# Patient Record
Sex: Female | Born: 1995 | Race: Black or African American | Hispanic: No | Marital: Single | Smoking: Never smoker
Health system: Southern US, Community
[De-identification: ages and names within clinical notes are randomized; demographics above are authoritative.]

## PROBLEM LIST (undated history)

## (undated) ENCOUNTER — Ambulatory Visit (HOSPITAL_COMMUNITY): Discharge status: Absent without leave | Payer: No Typology Code available for payment source

## (undated) DIAGNOSIS — Z789 Other specified health status: Secondary | ICD-10-CM

## (undated) HISTORY — PX: BREAST SURGERY: SHX581

---

## 2013-05-01 ENCOUNTER — Emergency Department (HOSPITAL_COMMUNITY)
Admission: EM | Admit: 2013-05-01 | Discharge: 2013-05-01 | Disposition: A | Discharge status: Home / Self Care | Payer: Medicaid Other | Attending: Emergency Medicine | Admitting: Emergency Medicine

## 2013-05-01 ENCOUNTER — Encounter (HOSPITAL_COMMUNITY): Payer: Self-pay | Admitting: Emergency Medicine

## 2013-05-01 DIAGNOSIS — Z3202 Encounter for pregnancy test, result negative: Secondary | ICD-10-CM | POA: Insufficient documentation

## 2013-05-01 DIAGNOSIS — R509 Fever, unspecified: Secondary | ICD-10-CM | POA: Insufficient documentation

## 2013-05-01 DIAGNOSIS — B761 Necatoriasis: Secondary | ICD-10-CM | POA: Insufficient documentation

## 2013-05-01 DIAGNOSIS — B760 Ancylostomiasis: Secondary | ICD-10-CM | POA: Insufficient documentation

## 2013-05-01 DIAGNOSIS — B769 Hookworm disease, unspecified: Secondary | ICD-10-CM

## 2013-05-01 LAB — URINE MICROSCOPIC-ADD ON

## 2013-05-01 LAB — URINALYSIS, ROUTINE W REFLEX MICROSCOPIC
Bilirubin Urine: NEGATIVE
Glucose, UA: NEGATIVE mg/dL
Ketones, ur: NEGATIVE mg/dL
Nitrite: NEGATIVE
Protein, ur: NEGATIVE mg/dL
Urobilinogen, UA: 0.2 mg/dL (ref 0.0–1.0)

## 2013-05-01 LAB — CBC WITH DIFFERENTIAL/PLATELET
Basophils Absolute: 0 10*3/uL (ref 0.0–0.1)
Basophils Relative: 0 % (ref 0–1)
HCT: 31.6 % — ABNORMAL LOW (ref 36.0–46.0)
Hemoglobin: 10.3 g/dL — ABNORMAL LOW (ref 12.0–15.0)
Lymphocytes Relative: 57 % — ABNORMAL HIGH (ref 12–46)
Lymphs Abs: 3.7 10*3/uL (ref 0.7–4.0)
MCHC: 32.6 g/dL (ref 30.0–36.0)
Monocytes Absolute: 0.5 10*3/uL (ref 0.1–1.0)
Monocytes Relative: 7 % (ref 3–12)
Neutro Abs: 2.2 10*3/uL (ref 1.7–7.7)
Neutrophils Relative %: 34 % — ABNORMAL LOW (ref 43–77)
RDW: 14.6 % (ref 11.5–15.5)
WBC: 6.5 10*3/uL (ref 4.0–10.5)

## 2013-05-01 LAB — COMPREHENSIVE METABOLIC PANEL
AST: 25 U/L (ref 0–37)
Albumin: 3.4 g/dL — ABNORMAL LOW (ref 3.5–5.2)
Alkaline Phosphatase: 36 U/L — ABNORMAL LOW (ref 39–117)
CO2: 26 mEq/L (ref 19–32)
Chloride: 108 mEq/L (ref 96–112)
Creatinine, Ser: 1.06 mg/dL (ref 0.50–1.10)
GFR calc Af Amer: 88 mL/min — ABNORMAL LOW (ref >90)
GFR calc non Af Amer: 76 mL/min — ABNORMAL LOW (ref >90)
Glucose, Bld: 85 mg/dL (ref 70–99)
Potassium: 4.8 mEq/L (ref 3.5–5.1)
Total Bilirubin: 0.2 mg/dL — ABNORMAL LOW (ref 0.3–1.2)

## 2013-05-01 LAB — POCT PREGNANCY, URINE: Preg Test, Ur: NEGATIVE

## 2013-05-01 MED ORDER — ALBENDAZOLE 200 MG PO TABS
400.0000 mg | ORAL_TABLET | Freq: Once | ORAL | Status: AC
  Administered 2013-05-01: 400 mg via ORAL
  Filled 2013-05-01: qty 2

## 2013-05-01 NOTE — ED Provider Notes (Signed)
CSN: [id number]     Arrival date & time 05/01/13  4098 History   First MD Initiated Contact with Patient 05/01/13 0254     Chief Complaint  Patient presents with  . Abdominal Pain   (Consider location/radiation/quality/duration/timing/severity/associated sxs/prior Treatment) Patient is a 17 y.o. female presenting with abdominal pain. The history is provided by the patient.  Abdominal Pain  is complaining of generalized lower abdominal pain without associated fever, chills. Noted in her stool this evening that she saw what looked like a worm. Denies any anal itching. No vomiting or diarrhea. No recent travel history. No strange foods. She has no abdominal pain at this time. He feels back to her normal self. Denies any urinary symptoms. History of similar symptoms in the past without diagnosis.  History reviewed. No pertinent past medical history. History reviewed. No pertinent past surgical history. No family history on file. History  Substance Use Topics  . Smoking status: Never Smoker   . Smokeless tobacco: Not on file  . Alcohol Use: No   OB History   Grav Para Term Preterm Abortions TAB SAB Ect Mult Living                 Review of Systems  Gastrointestinal: Positive for abdominal pain.  All other systems reviewed and are negative.    Allergies  Review of patient's allergies indicates no known allergies.  Home Medications  No current outpatient prescriptions on file. BP 116/77  Pulse 43  Temp(Src) 97.9 F (36.6 C) (Oral)  Resp 16  SpO2 100%  LMP 04/26/2013 Physical Exam  Nursing note and vitals reviewed. Constitutional: She is oriented to person, place, and time. She appears well-developed and well-nourished.  Non-toxic appearance. No distress.  HENT:  Head: Normocephalic and atraumatic.  Eyes: Conjunctivae, EOM and lids are normal. Pupils are equal, round, and reactive to light.  Neck: Normal range of motion. Neck supple. No tracheal deviation present. No mass  present.  Cardiovascular: Normal rate, regular rhythm and normal heart sounds.  Exam reveals no gallop.   No murmur heard. Pulmonary/Chest: Effort normal and breath sounds normal. No stridor. No respiratory distress. She has no decreased breath sounds. She has no wheezes. She has no rhonchi. She has no rales.  Abdominal: Soft. Normal appearance and bowel sounds are normal. She exhibits no distension. There is no tenderness. There is no rebound and no CVA tenderness.  Musculoskeletal: Normal range of motion. She exhibits no edema and no tenderness.  Neurological: She is alert and oriented to person, place, and time. She has normal strength. No cranial nerve deficit or sensory deficit. GCS eye subscore is 4. GCS verbal subscore is 5. GCS motor subscore is 6.  Skin: Skin is warm and dry. No abrasion and no rash noted.  Psychiatric: She has a normal mood and affect. Her speech is normal and behavior is normal.    ED Course  Procedures (including critical care time) Labs Review Labs Reviewed  CBC WITH DIFFERENTIAL - Abnormal; Notable for the following:    RBC 3.65 (*)    Hemoglobin 10.3 (*)    HCT 31.6 (*)    Neutrophils Relative % 34 (*)    Lymphocytes Relative 57 (*)    All other components within normal limits  OVA AND PARASITE EXAMINATION  COMPREHENSIVE METABOLIC PANEL  URINALYSIS, ROUTINE W REFLEX MICROSCOPIC   Imaging Review No results found.  EKG Interpretation   None       MDM  No diagnosis found. Patient  is to be treated for suspected hook worm infection.    Toy Baker, MD 05/01/13 [phone]

## 2013-05-01 NOTE — ED Notes (Signed)
Pt. reports low abdominal cramping and noted worms at stools this evening , denies rectal itching or bleeding.

## 2013-05-01 NOTE — ED Notes (Signed)
Dr Allen at bedside  

## 2013-05-02 LAB — URINE CULTURE: Colony Count: 35000

## 2013-05-04 LAB — OVA AND PARASITE EXAMINATION: Ova and parasites: NONE SEEN

## 2013-05-05 LAB — STOOL CULTURE: Special Requests: NORMAL

## 2013-08-16 ENCOUNTER — Encounter (HOSPITAL_COMMUNITY): Payer: Self-pay | Admitting: Emergency Medicine

## 2013-08-16 ENCOUNTER — Emergency Department (HOSPITAL_COMMUNITY)
Admission: EM | Admit: 2013-08-16 | Discharge: 2013-08-16 | Disposition: A | Discharge status: Home / Self Care | Payer: Medicaid Other | Attending: Emergency Medicine | Admitting: Emergency Medicine

## 2013-08-16 DIAGNOSIS — R102 Pelvic and perineal pain: Secondary | ICD-10-CM

## 2013-08-16 DIAGNOSIS — Z3202 Encounter for pregnancy test, result negative: Secondary | ICD-10-CM | POA: Insufficient documentation

## 2013-08-16 DIAGNOSIS — N949 Unspecified condition associated with female genital organs and menstrual cycle: Secondary | ICD-10-CM | POA: Insufficient documentation

## 2013-08-16 LAB — URINE MICROSCOPIC-ADD ON

## 2013-08-16 LAB — URINALYSIS, ROUTINE W REFLEX MICROSCOPIC
BILIRUBIN URINE: NEGATIVE
GLUCOSE, UA: NEGATIVE mg/dL
KETONES UR: 15 mg/dL — AB
Leukocytes, UA: NEGATIVE
Nitrite: NEGATIVE
PROTEIN: 30 mg/dL — AB
Specific Gravity, Urine: 1.041 — ABNORMAL HIGH (ref 1.005–1.030)
UROBILINOGEN UA: 1 mg/dL (ref 0.0–1.0)
pH: 6 (ref 5.0–8.0)

## 2013-08-16 LAB — CBC WITH DIFFERENTIAL/PLATELET
Basophils Absolute: 0 10*3/uL (ref 0.0–0.1)
Basophils Relative: 0 % (ref 0–1)
EOS PCT: 2 % (ref 0–5)
Eosinophils Absolute: 0.1 10*3/uL (ref 0.0–0.7)
HCT: 30.6 % — ABNORMAL LOW (ref 36.0–46.0)
Hemoglobin: 10.1 g/dL — ABNORMAL LOW (ref 12.0–15.0)
LYMPHS ABS: 2.8 10*3/uL (ref 0.7–4.0)
LYMPHS PCT: 40 % (ref 12–46)
MCH: 27.3 pg (ref 26.0–34.0)
MCHC: 33 g/dL (ref 30.0–36.0)
MCV: 82.7 fL (ref 78.0–100.0)
Monocytes Absolute: 0.5 10*3/uL (ref 0.1–1.0)
Monocytes Relative: 7 % (ref 3–12)
NEUTROS ABS: 3.5 10*3/uL (ref 1.7–7.7)
Neutrophils Relative %: 51 % (ref 43–77)
PLATELETS: 341 10*3/uL (ref 150–400)
RBC: 3.7 MIL/uL — AB (ref 3.87–5.11)
RDW: 15.2 % (ref 11.5–15.5)
WBC: 6.9 10*3/uL (ref 4.0–10.5)

## 2013-08-16 LAB — COMPREHENSIVE METABOLIC PANEL
ALK PHOS: 40 U/L (ref 39–117)
ALT: 13 U/L (ref 0–35)
AST: 27 U/L (ref 0–37)
Albumin: 3.7 g/dL (ref 3.5–5.2)
BILIRUBIN TOTAL: 0.3 mg/dL (ref 0.3–1.2)
BUN: 15 mg/dL (ref 6–23)
CALCIUM: 8.9 mg/dL (ref 8.4–10.5)
CHLORIDE: 103 meq/L (ref 96–112)
CO2: 23 meq/L (ref 19–32)
Creatinine, Ser: 0.9 mg/dL (ref 0.50–1.10)
GLUCOSE: 94 mg/dL (ref 70–99)
Potassium: 3.8 mEq/L (ref 3.7–5.3)
SODIUM: 138 meq/L (ref 137–147)
Total Protein: 6.8 g/dL (ref 6.0–8.3)

## 2013-08-16 LAB — WET PREP, GENITAL
Trich, Wet Prep: NONE SEEN
WBC, Wet Prep HPF POC: NONE SEEN
Yeast Wet Prep HPF POC: NONE SEEN

## 2013-08-16 LAB — LIPASE, BLOOD: Lipase: 38 U/L (ref 11–59)

## 2013-08-16 LAB — PREGNANCY, URINE: Preg Test, Ur: NEGATIVE

## 2013-08-16 MED ORDER — METRONIDAZOLE 500 MG PO TABS: 500.0000 mg | ORAL_TABLET | Freq: Two times a day (BID) | ORAL | Status: DC

## 2013-08-16 MED ORDER — NAPROXEN 250 MG PO TABS
500.0000 mg | ORAL_TABLET | Freq: Once | ORAL | Status: AC
  Administered 2013-08-16: 500 mg via ORAL
  Filled 2013-08-16: qty 2

## 2013-08-16 MED ORDER — NAPROXEN 500 MG PO TABS: 500.0000 mg | ORAL_TABLET | Freq: Two times a day (BID) | ORAL | Status: DC

## 2013-08-16 NOTE — Discharge Instructions (Signed)
Pelvic Infection ° °If you have been diagnosed with a pelvic infection such as a sexually transmitted disease, you will need to be treated with antibiotics. Please take the medicines as prescribed. Some of these tests do not come back for 1-2 days in which case if they turn positive you will receive a phone call to let you know. If you are contacted and do have an infection consistent with a sexually transmitted disease, then you will need to tell any and all sexual partners that you have had in the last 6 months no so that they can be tested and treated as well. If you should develop severe or worsening pain in your abdomen or the pelvis or develop severe fevers,nausea or vomiting that prevent you from taking your medications, return to the emergency department immediately. Otherwise contact your local physician or county health department for a follow up appointment to complete STD testing including HIV and syphilis.  See the list of phone numbers below. ° °RESOURCE GUIDE ° °Dental Problems ° °Patients with Medicaid: °[facility]                      Dental °[address]                                           [address] °Phone:  [phone]                                                  Phone:  [phone] ° °If unable to pay or uninsured, contact:  [facility] or [facility] to become qualified for the adult dental clinic. ° °Chronic Pain Problems °Contact [facility]  [phone] °Patients need to be referred by their primary care doctor. ° °Insufficient Money for Medicine °Contact [employer]:  call "211" or [facility] 271-5999. ° °No Primary Care Doctor °Call [facility]  832-8000 °Other agencies that provide inexpensive medical care °   [facility]  [phone] °   [facility]  [phone] °   [facility]  [phone] °   [facility]  [phone] °   [facility]  [phone] ° [facility]  [phone] ° °Psychological Services °[facility]  [phone] °[facility]  [phone] °[facility]   [phone] (emergency services [phone]) ° °Substance Abuse Resources °[facility]  336-882-2125 °[facility] 336-784-9470 °[facility] 336-285-9073 °[facility] 336-845-3988 °Residential & Outpatient Substance Abuse Program  800-659-3381 ° °Abuse/Neglect °[place] [facility] (336) 641-3795 °[place] [facility] 800-378-5315 (After Hours) ° °Emergency Shelter °[facility] (336) 271-5985 ° °Maternity Homes °[facility] (336) 275-9566 °[facility] (704) 372-4663 ° °MRSA Hotline #:   832-7006 ° ° ° °[place] [facility] ° °[facility]     [employer]                          [facility] °[address] [place]                         [address]      [address]  °[place]                                                [place]                            [place] °Phone:  349-3220                                   Phone:  [phone]                 Phone:  342-8140 ° °[facility] °Phone:  342-8316 ° °[place] [facility] °(336) 342-1394 °(336) 342-3537 (After Hours) ° ° ° °

## 2013-08-16 NOTE — ED Notes (Signed)
The pt is c/o lower abd pain for 2 days nausea.  lmp today

## 2013-08-16 NOTE — ED Provider Notes (Signed)
CSN: [id number]     Arrival date & time 08/16/13  0110 History   First MD Initiated Contact with Patient 08/16/13 [phone]     Chief Complaint  Patient presents with  . Abdominal Pain     (Consider location/radiation/quality/duration/timing/severity/associated sxs/prior Treatment) HPI Comments: 18 year old female presents with one day of abdominal pain, bilateral lower abdominal pain in location, no radiation of the pain, no vomiting however she does have some nausea. There is no diarrhea, no dysuria, small amount of vaginal discharge which has been present for several days. She does have a history of Chlamydia. Has taken ibuprofen prior to arrival.  Patient is a 18 y.o. female presenting with abdominal pain. The history is provided by the patient.  Abdominal Pain   History reviewed. No pertinent past medical history. History reviewed. No pertinent past surgical history. No family history on file. History  Substance Use Topics  . Smoking status: Never Smoker   . Smokeless tobacco: Not on file  . Alcohol Use: No   OB History   Grav Para Term Preterm Abortions TAB SAB Ect Mult Living                 Review of Systems  Gastrointestinal: Positive for abdominal pain.  All other systems reviewed and are negative.      Allergies  Review of patient's allergies indicates no known allergies.  Home Medications   Current Outpatient Rx  Name  Route  Sig  Dispense  Refill  . ibuprofen (ADVIL,MOTRIN) 200 MG tablet   Oral   Take 800 mg by mouth every 6 (six) hours as needed for headache or moderate pain.         . metroNIDAZOLE (FLAGYL) 500 MG tablet   Oral   Take 1 tablet (500 mg total) by mouth 2 (two) times daily.   14 tablet   0   . naproxen (NAPROSYN) 500 MG tablet   Oral   Take 1 tablet (500 mg total) by mouth 2 (two) times daily with a meal.   30 tablet   0    BP 100/63  Pulse 56  Temp(Src) 97.9 F (36.6 C) (Oral)  Resp 16  Ht 5\' 8"  (1.727 m)  Wt 138 lb 1 oz  (62.625 kg)  BMI 21.00 kg/m2  SpO2 100%  LMP 08/16/2013 Physical Exam  Nursing note and vitals reviewed. Constitutional: She appears well-developed and well-nourished. No distress.  HENT:  Head: Normocephalic and atraumatic.  Mouth/Throat: Oropharynx is clear and moist. No oropharyngeal exudate.  Eyes: Conjunctivae and EOM are normal. Pupils are equal, round, and reactive to light. Right eye exhibits no discharge. Left eye exhibits no discharge. No scleral icterus.  Neck: Normal range of motion. Neck supple. No JVD present. No thyromegaly present.  Cardiovascular: Normal rate, regular rhythm, normal heart sounds and intact distal pulses.  Exam reveals no gallop and no friction rub.   No murmur heard. Pulmonary/Chest: Effort normal and breath sounds normal. No respiratory distress. She has no wheezes. She has no rales.  Abdominal: Soft. Bowel sounds are normal. She exhibits no distension and no mass. There is tenderness ( Minimal tenderness to the bilateral lower abdomin).  Genitourinary:  Chaperone present for exam, small amount of blood in the vaginal vault, no other discharge, no cervical motion tenderness or adnexal tenderness or masses.  Musculoskeletal: Normal range of motion. She exhibits no edema and no tenderness.  Lymphadenopathy:    She has no cervical adenopathy.  Neurological: She is alert. Coordination  normal.  Skin: Skin is warm and dry. No rash noted. No erythema.  Psychiatric: She has a normal mood and affect. Her behavior is normal.    ED Course  Procedures (including critical care time) Labs Review Labs Reviewed  CBC WITH DIFFERENTIAL - Abnormal; Notable for the following:    RBC 3.70 (*)    Hemoglobin 10.1 (*)    HCT 30.6 (*)    All other components within normal limits  URINALYSIS, ROUTINE W REFLEX MICROSCOPIC - Abnormal; Notable for the following:    Specific Gravity, Urine 1.041 (*)    Hgb urine dipstick LARGE (*)    Ketones, ur 15 (*)    Protein, ur 30  (*)    All other components within normal limits  URINE MICROSCOPIC-ADD ON - Abnormal; Notable for the following:    Squamous Epithelial / LPF FEW (*)    All other components within normal limits  GC/CHLAMYDIA PROBE AMP  WET PREP, GENITAL  COMPREHENSIVE METABOLIC PANEL  PREGNANCY, URINE  LIPASE, BLOOD   Imaging Review No results found.   EKG Interpretation None      MDM   Final diagnoses:  Pelvic pain    Labs a urinalysis unremarkable, vital signs normal, we'll check for STDs, BV, the patient is stable.  The patient has a benign presentation, she is stable for discharge  Meds given in ED:  Medications  naproxen (NAPROSYN) tablet 500 mg (not administered)    New Prescriptions   METRONIDAZOLE (FLAGYL) 500 MG TABLET    Take 1 tablet (500 mg total) by mouth 2 (two) times daily.   NAPROXEN (NAPROSYN) 500 MG TABLET    Take 1 tablet (500 mg total) by mouth 2 (two) times daily with a meal.      Vida Roller, MD 08/16/13 [phone]

## 2013-08-17 LAB — GC/CHLAMYDIA PROBE AMP
CT PROBE, AMP APTIMA: NEGATIVE
GC Probe RNA: NEGATIVE

## 2013-12-11 ENCOUNTER — Emergency Department (INDEPENDENT_AMBULATORY_CARE_PROVIDER_SITE_OTHER)
Admission: EM | Admit: 2013-12-11 | Discharge: 2013-12-11 | Disposition: A | Discharge status: Home / Self Care | Payer: Medicaid Other | Source: Home / Self Care | Attending: Emergency Medicine | Admitting: Emergency Medicine

## 2013-12-11 ENCOUNTER — Encounter (HOSPITAL_COMMUNITY): Payer: Self-pay | Admitting: Emergency Medicine

## 2013-12-11 DIAGNOSIS — T280XXA Burn of mouth and pharynx, initial encounter: Secondary | ICD-10-CM

## 2013-12-11 NOTE — ED Provider Notes (Signed)
  Chief Complaint   Chief Complaint  Patient presents with  . Mouth Lesions    History of Present Illness   Emma Thompson is a 18 year old female who noted a spot on the roof of her mouth this evening after eating at a restaurant. The patient had a burger and some fries. She does not think the food was particularly hot. The spot is not painful. She denies any other intraoral lesions, sore throat, adenopathy, fever, headache, stiff neck, nasal congestion, rhinorrhea.  Review of Systems   Other than as noted above, the patient denies any of the following symptoms: Systemic:  No fevers or chills. Eye:  No redness, pain, discharge, itching, blurred vision, or diplopia. ENT:  No headache, nasal congestion, sneezing, itching, epistaxis, ear pain, decreased hearing, ringing in ears, vertigo, or tinnitus.  No oral lesions, sore throat, or hoarseness. Neck:  No neck pain or adenopathy. Skin:  No rash or itching.  PMFSH   Past medical history, family history, social history, meds, and allergies were reviewed.   Physical Examination     Vital signs:  BP 106/65  Pulse 71  Temp(Src) 98.8 F (37.1 C) (Oral)  Resp 14  SpO2 100% General:  Alert and oriented.  In no distress.  Skin warm and dry. Eye:  PERRL, full EOMs, lids and conjunctiva normal.   ENT:  TMs and canals clear.  Nasal mucosa not congested and without drainage.  Mucous membranes moist, she has a small, blood-filled blister on the hard palate, adjacent to the right, upper first and second molars, normal dentition, pharynx clear.  No cranial or facial pain to palplation. There is no pain or swelling over the mastoid. Neck:  Supple, full ROM.  No adenopathy, tenderness or mass.  Thyroid normal. Lungs:  Breath sounds clear and equal bilaterally.  No wheezes, rales or rhonchi. Heart:  Rhythm regular, without extrasystoles.  No gallops or murmers. Skin:  Clear, warm and dry.   Assessment   The encounter diagnosis was Mouth burn,  initial encounter.  Probably due to eating hot food.  Plan    1.  Meds:  The following meds were prescribed:   Discharge Medication List as of 12/11/2013  8:28 PM      2.  Patient Education/Counseling:  The patient was given appropriate handouts, self care instructions, and instructed in symptomatic relief.    3.  Follow up:  The patient was told to follow up here if no better in 3 to 4 days, or sooner if becoming worse in any way, and given some red flag symptoms such as further blistering of the mouth, severe pain, or difficulty swallowing or breathing which would prompt immediate return.       Reuben Likesavid C Vonzell Lindblad, MD 12/11/13 2113

## 2013-12-11 NOTE — Discharge Instructions (Signed)
Rinse mouth with warm salt water 4 times daily.  Avoid spicey food, acid food, and very hot food.

## 2013-12-11 NOTE — ED Notes (Signed)
Concerned about red spot on roof she noted after eating tonight. Her girlfriend here for STD check

## 2014-05-10 ENCOUNTER — Emergency Department (INDEPENDENT_AMBULATORY_CARE_PROVIDER_SITE_OTHER)
Admission: EM | Admit: 2014-05-10 | Discharge: 2014-05-10 | Disposition: A | Discharge status: Home / Self Care | Payer: Medicaid Other | Source: Home / Self Care | Attending: Family Medicine | Admitting: Family Medicine

## 2014-05-10 ENCOUNTER — Encounter (HOSPITAL_COMMUNITY): Payer: Self-pay | Admitting: Emergency Medicine

## 2014-05-10 DIAGNOSIS — N390 Urinary tract infection, site not specified: Secondary | ICD-10-CM

## 2014-05-10 LAB — POCT URINALYSIS DIP (DEVICE)
Bilirubin Urine: NEGATIVE
Glucose, UA: NEGATIVE mg/dL
Ketones, ur: NEGATIVE mg/dL
NITRITE: NEGATIVE
PH: 7 (ref 5.0–8.0)
Protein, ur: NEGATIVE mg/dL
Specific Gravity, Urine: 1.02 (ref 1.005–1.030)
UROBILINOGEN UA: 0.2 mg/dL (ref 0.0–1.0)

## 2014-05-10 LAB — POCT PREGNANCY, URINE: Preg Test, Ur: NEGATIVE

## 2014-05-10 MED ORDER — FLUCONAZOLE 150 MG PO TABS: 150.0000 mg | ORAL_TABLET | Freq: Once | ORAL | Status: DC

## 2014-05-10 MED ORDER — CEPHALEXIN 500 MG PO CAPS: 500.0000 mg | ORAL_CAPSULE | Freq: Two times a day (BID) | ORAL | Status: DC

## 2014-05-10 NOTE — ED Provider Notes (Signed)
Emma Thompson is a 18 y.o. female who presents to Urgent Care today for pelvic pain. Patient is a 2 day history of mild lower abdominal/pelvic pain. She notes this is associated with urinary frequency and urgency and dysuria. She has not tried any medications yet. 5 days ago she [facility] and is taking over-the-counter creams. At that time gonorrhea and Chlamydia were negative. No fevers or chills vomiting or diarrhea.   History reviewed. No pertinent past medical history. History reviewed. No pertinent past surgical history. History  Substance Use Topics  . Smoking status: Never Smoker   . Smokeless tobacco: Not on file  . Alcohol Use: No   ROS as above Medications: No current facility-administered medications for this encounter.   Current Outpatient Prescriptions  Medication Sig Dispense Refill  . cephALEXin (KEFLEX) 500 MG capsule Take 1 capsule (500 mg total) by mouth 2 (two) times daily. 14 capsule 0  . fluconazole (DIFLUCAN) 150 MG tablet Take 1 tablet (150 mg total) by mouth once. 1 tablet 1  . ibuprofen (ADVIL,MOTRIN) 200 MG tablet Take 800 mg by mouth every 6 (six) hours as needed for headache or moderate pain.    . naproxen (NAPROSYN) 500 MG tablet Take 1 tablet (500 mg total) by mouth 2 (two) times daily with a meal. 30 tablet 0   No Known Allergies   Exam:  BP 109/66 mmHg  Pulse 80  Temp(Src) 98.2 F (36.8 C) (Oral)  Resp 14  SpO2 99%  LMP 05/05/2014 Gen: Well NAD HEENT: EOMI,  MMM Lungs: Normal work of breathing. CTABL Heart: RRR no MRG Abd: NABS, Soft. Nondistended, Nontender no CV angle tenderness to percussion Exts: Brisk capillary refill, warm and well perfused.   Results for orders placed or performed during the hospital encounter of 05/10/14 (from the past 24 hour(s))  POCT urinalysis dip (device)     Status: Abnormal   Collection Time: 05/10/14  4:14 PM  Result Value Ref Range   Glucose, UA NEGATIVE  NEGATIVE mg/dL   Bilirubin Urine NEGATIVE NEGATIVE   Ketones, ur NEGATIVE NEGATIVE mg/dL   Specific Gravity, Urine 1.020 1.005 - 1.030   Hgb urine dipstick SMALL (A) NEGATIVE   pH 7.0 5.0 - 8.0   Protein, ur NEGATIVE NEGATIVE mg/dL   Urobilinogen, UA 0.2 0.0 - 1.0 mg/dL   Nitrite NEGATIVE NEGATIVE   Leukocytes, UA TRACE (A) NEGATIVE  Pregnancy, urine POC     Status: None   Collection Time: 05/10/14  4:17 PM  Result Value Ref Range   Preg Test, Ur NEGATIVE NEGATIVE   No results found.  Assessment and Plan: 18 y.o. female with UTI most likely explanation. Urine culture pending. Defer gynecologic exam as this has just been done. Treat with Keflex and fluconazole. Follow-up with PCP as needed.  Discussed warning signs or symptoms. Please see discharge instructions. Patient expresses understanding.     Rodolph BongEvan S Corey, MD 05/10/14 [phone]

## 2014-05-10 NOTE — Discharge Instructions (Signed)
Thank you for coming in today. Take keflex twice daily for 1 week.  Take fluconazole once.  If your belly pain worsens, or you have high fever, bad vomiting, blood in your stool or black tarry stool go to the Emergency Room.    Urinary Tract Infection Urinary tract infections (UTIs) can develop anywhere along your urinary tract. Your urinary tract is your body's drainage system for removing wastes and extra water. Your urinary tract includes two kidneys, two ureters, a bladder, and a urethra. Your kidneys are a pair of bean-shaped organs. Each kidney is about the size of your fist. They are located below your ribs, one on each side of your spine. CAUSES Infections are caused by microbes, which are microscopic organisms, including fungi, viruses, and bacteria. These organisms are so small that they can only be seen through a microscope. Bacteria are the microbes that most commonly cause UTIs. SYMPTOMS  Symptoms of UTIs may vary by age and gender of the patient and by the location of the infection. Symptoms in young women typically include a frequent and intense urge to urinate and a painful, burning feeling in the bladder or urethra during urination. Older women and men are more likely to be tired, shaky, and weak and have muscle aches and abdominal pain. A fever may mean the infection is in your kidneys. Other symptoms of a kidney infection include pain in your back or sides below the ribs, nausea, and vomiting. DIAGNOSIS To diagnose a UTI, your caregiver will ask you about your symptoms. Your caregiver also will ask to provide a urine sample. The urine sample will be tested for bacteria and white blood cells. White blood cells are made by your body to help fight infection. TREATMENT  Typically, UTIs can be treated with medication. Because most UTIs are caused by a bacterial infection, they usually can be treated with the use of antibiotics. The choice of antibiotic and length of treatment depend on  your symptoms and the type of bacteria causing your infection. HOME CARE INSTRUCTIONS  If you were prescribed antibiotics, take them exactly as your caregiver instructs you. Finish the medication even if you feel better after you have only taken some of the medication.  Drink enough water and fluids to keep your urine clear or pale yellow.  Avoid caffeine, tea, and carbonated beverages. They tend to irritate your bladder.  Empty your bladder often. Avoid holding urine for long periods of time.  Empty your bladder before and after sexual intercourse.  After a bowel movement, women should cleanse from front to back. Use each tissue only once. SEEK MEDICAL CARE IF:   You have back pain.  You develop a fever.  Your symptoms do not begin to resolve within 3 days. SEEK IMMEDIATE MEDICAL CARE IF:   You have severe back pain or lower abdominal pain.  You develop chills.  You have nausea or vomiting.  You have continued burning or discomfort with urination. MAKE SURE YOU:   Understand these instructions.  Will watch your condition.  Will get help right away if you are not doing well or get worse. Document Released: 02/21/2005 Document Revised: 11/13/2011 Document Reviewed: 06/22/2011 [facility] Patient Information 2015 [place], [place]. This information is not intended to replace advice given to you by your health care provider. Make sure you discuss any questions you have with your health care provider.

## 2014-05-11 LAB — URINE CULTURE: SPECIAL REQUESTS: NORMAL

## 2014-05-26 ENCOUNTER — Other Ambulatory Visit (HOSPITAL_COMMUNITY): Payer: Self-pay | Admitting: General Practice

## 2014-05-26 DIAGNOSIS — IMO0002 Reserved for concepts with insufficient information to code with codable children: Secondary | ICD-10-CM

## 2014-05-26 DIAGNOSIS — R229 Localized swelling, mass and lump, unspecified: Principal | ICD-10-CM

## 2014-06-01 ENCOUNTER — Other Ambulatory Visit (HOSPITAL_COMMUNITY): Payer: Self-pay | Admitting: General Practice

## 2014-06-01 ENCOUNTER — Ambulatory Visit (HOSPITAL_COMMUNITY)
Admission: RE | Admit: 2014-06-01 | Discharge: 2014-06-01 | Disposition: A | Discharge status: Home / Self Care | Payer: Medicaid Other | Source: Ambulatory Visit | Attending: General Practice | Admitting: General Practice

## 2014-06-01 DIAGNOSIS — R229 Localized swelling, mass and lump, unspecified: Secondary | ICD-10-CM

## 2014-06-01 DIAGNOSIS — N63 Unspecified lump in breast: Secondary | ICD-10-CM | POA: Insufficient documentation

## 2014-06-01 DIAGNOSIS — IMO0002 Reserved for concepts with insufficient information to code with codable children: Secondary | ICD-10-CM

## 2014-08-07 ENCOUNTER — Encounter (HOSPITAL_COMMUNITY): Payer: Self-pay

## 2014-08-07 ENCOUNTER — Inpatient Hospital Stay (HOSPITAL_COMMUNITY)
Admission: AD | Admit: 2014-08-07 | Discharge: 2014-08-07 | Disposition: A | Discharge status: Home / Self Care | Payer: Medicaid Other | Source: Ambulatory Visit | Attending: Family Medicine | Admitting: Family Medicine

## 2014-08-07 DIAGNOSIS — Z3202 Encounter for pregnancy test, result negative: Secondary | ICD-10-CM | POA: Diagnosis not present

## 2014-08-07 DIAGNOSIS — B3731 Acute candidiasis of vulva and vagina: Secondary | ICD-10-CM

## 2014-08-07 DIAGNOSIS — B373 Candidiasis of vulva and vagina: Secondary | ICD-10-CM | POA: Insufficient documentation

## 2014-08-07 DIAGNOSIS — N898 Other specified noninflammatory disorders of vagina: Secondary | ICD-10-CM | POA: Insufficient documentation

## 2014-08-07 HISTORY — DX: Other specified health status: Z78.9

## 2014-08-07 LAB — POCT PREGNANCY, URINE: PREG TEST UR: NEGATIVE

## 2014-08-07 LAB — URINALYSIS, ROUTINE W REFLEX MICROSCOPIC
Bilirubin Urine: NEGATIVE
GLUCOSE, UA: NEGATIVE mg/dL
Ketones, ur: NEGATIVE mg/dL
NITRITE: NEGATIVE
Protein, ur: NEGATIVE mg/dL
Specific Gravity, Urine: 1.025 (ref 1.005–1.030)
Urobilinogen, UA: 0.2 mg/dL (ref 0.0–1.0)
pH: 6 (ref 5.0–8.0)

## 2014-08-07 LAB — URINE MICROSCOPIC-ADD ON

## 2014-08-07 LAB — WET PREP, GENITAL: Trich, Wet Prep: NONE SEEN

## 2014-08-07 MED ORDER — FLUCONAZOLE 150 MG PO TABS: 150.0000 mg | ORAL_TABLET | Freq: Every day | ORAL | Status: DC

## 2014-08-07 NOTE — Discharge Instructions (Signed)

## 2014-08-07 NOTE — MAU Note (Signed)
[facility] and trich.  Completed meds and when finished meds, started having a milky discharge and felt itchy x 2days.

## 2014-08-07 NOTE — MAU Provider Note (Signed)
History     CSN: [id number]  Arrival date and time: 08/07/14 0403   First Provider Initiated Contact with Patient 08/07/14 0444      Chief Complaint  Patient presents with  . Vaginal Discharge   HPI    [name] is a  19 y.o. female who presents with vaginal discharge. She recently completed a 7 day course of flagyl for trichomonas and BV. Following treatment she has noticed a thick, white discharge.   OB History    Gravida Para Term Preterm AB TAB SAB Ectopic Multiple Living   0 0 0 0 0 0 0 0 0 0       Past Medical History  Diagnosis Date  . Medical history non-contributory     Past Surgical History  Procedure Laterality Date  . No past surgeries      Family History  Problem Relation Age of Onset  . Hyperlipidemia Sister     History  Substance Use Topics  . Smoking status: Never Smoker   . Smokeless tobacco: Not on file  . Alcohol Use: No    Allergies: No Known Allergies  Prescriptions prior to admission  Medication Sig Dispense Refill Last Dose  . cephALEXin (KEFLEX) 500 MG capsule Take 1 capsule (500 mg total) by mouth 2 (two) times daily. 14 capsule 0   . fluconazole (DIFLUCAN) 150 MG tablet Take 1 tablet (150 mg total) by mouth once. 1 tablet 1   . ibuprofen (ADVIL,MOTRIN) 200 MG tablet Take 800 mg by mouth every 6 (six) hours as needed for headache or moderate pain.   Unknown at Unknown time  . naproxen (NAPROSYN) 500 MG tablet Take 1 tablet (500 mg total) by mouth 2 (two) times daily with a meal. 30 tablet 0 Unknown at Unknown time   Results for orders placed or performed during the hospital encounter of 08/07/14 (from the past 48 hour(s))  Wet prep, genital     Status: Abnormal   Collection Time: 08/07/14  4:20 AM  Result Value Ref Range   Yeast Wet Prep HPF POC FEW (A) NONE SEEN   Trich, Wet Prep NONE SEEN NONE SEEN   Clue Cells Wet Prep HPF POC RARE (A) NONE SEEN   WBC, Wet Prep HPF POC MODERATE (A) NONE SEEN    Comment: MANY BACTERIA  SEEN  Urinalysis, Routine w reflex microscopic     Status: Abnormal   Collection Time: 08/07/14  4:20 AM  Result Value Ref Range   Color, Urine YELLOW YELLOW   APPearance CLEAR CLEAR   Specific Gravity, Urine 1.025 1.005 - 1.030   pH 6.0 5.0 - 8.0   Glucose, UA NEGATIVE NEGATIVE mg/dL   Hgb urine dipstick TRACE (A) NEGATIVE   Bilirubin Urine NEGATIVE NEGATIVE   Ketones, ur NEGATIVE NEGATIVE mg/dL   Protein, ur NEGATIVE NEGATIVE mg/dL   Urobilinogen, UA 0.2 0.0 - 1.0 mg/dL   Nitrite NEGATIVE NEGATIVE   Leukocytes, UA SMALL (A) NEGATIVE  Urine microscopic-add on     Status: None   Collection Time: 08/07/14  4:20 AM  Result Value Ref Range   Squamous Epithelial / LPF RARE RARE   WBC, UA 0-2 <3 WBC/hpf   RBC / HPF 0-2 <3 RBC/hpf   Bacteria, UA RARE RARE  Pregnancy, urine POC     Status: None   Collection Time: 08/07/14  4:37 AM  Result Value Ref Range   Preg Test, Ur NEGATIVE NEGATIVE    Comment:  THE SENSITIVITY OF THIS METHODOLOGY IS >24 mIU/mL      Review of Systems  Constitutional: Negative for fever and chills.  Genitourinary: Negative for dysuria, urgency, frequency and hematuria.       + vaginal itching    Physical Exam   Blood pressure 112/70, pulse 75, temperature 98.4 F (36.9 C), temperature source Oral, resp. rate 18, height  (1.676 m), weight 61.803 kg (136 lb 4 oz), last menstrual period 07/29/2014, SpO2 100 %.  Physical Exam  Constitutional: She is oriented to person, place, and time. She appears well-developed and well-nourished. No distress.  Genitourinary:  Patient collected wet prep and GC  Neurological: She is alert and oriented to person, place, and time.  Skin: Skin is warm. She is not diaphoretic.  Psychiatric: Her behavior is normal.    MAU Course  Procedures  None  MDM HIV UPT UA  Wet prep GC  Assessment and Plan   A:  1. Yeast vaginitis    P:  Discharge home in stable condition RX: Diflucan Follow up with HD  as needed Condoms always   Emma Lope, NP 08/07/2014 4:46 AM

## 2014-08-08 LAB — HIV ANTIBODY (ROUTINE TESTING W REFLEX): HIV Screen 4th Generation wRfx: NONREACTIVE

## 2014-08-09 LAB — GC/CHLAMYDIA PROBE AMP (~~LOC~~) NOT AT ARMC
Chlamydia: NEGATIVE
Neisseria Gonorrhea: NEGATIVE

## 2014-08-22 ENCOUNTER — Other Ambulatory Visit (HOSPITAL_COMMUNITY)
Admission: RE | Admit: 2014-08-22 | Discharge: 2014-08-22 | Disposition: A | Discharge status: Home / Self Care | Payer: Medicaid Other | Source: Ambulatory Visit | Attending: Family Medicine | Admitting: Family Medicine

## 2014-08-22 ENCOUNTER — Emergency Department (INDEPENDENT_AMBULATORY_CARE_PROVIDER_SITE_OTHER)
Admission: EM | Admit: 2014-08-22 | Discharge: 2014-08-22 | Disposition: A | Discharge status: Home / Self Care | Payer: Medicaid Other | Source: Home / Self Care | Attending: Family Medicine | Admitting: Family Medicine

## 2014-08-22 ENCOUNTER — Encounter (HOSPITAL_COMMUNITY): Payer: Self-pay | Admitting: Emergency Medicine

## 2014-08-22 DIAGNOSIS — N390 Urinary tract infection, site not specified: Secondary | ICD-10-CM | POA: Diagnosis not present

## 2014-08-22 DIAGNOSIS — N760 Acute vaginitis: Secondary | ICD-10-CM | POA: Diagnosis present

## 2014-08-22 DIAGNOSIS — Z113 Encounter for screening for infections with a predominantly sexual mode of transmission: Secondary | ICD-10-CM | POA: Insufficient documentation

## 2014-08-22 LAB — POCT URINALYSIS DIP (DEVICE)
BILIRUBIN URINE: NEGATIVE
Glucose, UA: NEGATIVE mg/dL
Ketones, ur: NEGATIVE mg/dL
Nitrite: NEGATIVE
Protein, ur: NEGATIVE mg/dL
Specific Gravity, Urine: 1.02 (ref 1.005–1.030)
Urobilinogen, UA: 0.2 mg/dL (ref 0.0–1.0)
pH: 7 (ref 5.0–8.0)

## 2014-08-22 LAB — POCT PREGNANCY, URINE: PREG TEST UR: NEGATIVE

## 2014-08-22 MED ORDER — FLUCONAZOLE 150 MG PO TABS: 150.0000 mg | ORAL_TABLET | Freq: Once | ORAL | Status: DC

## 2014-08-22 MED ORDER — CEFUROXIME AXETIL 250 MG PO TABS
250.0000 mg | ORAL_TABLET | Freq: Two times a day (BID) | ORAL | Status: DC
Start: 2014-08-22 — End: 2014-09-14

## 2014-08-22 NOTE — Discharge Instructions (Signed)

## 2014-08-22 NOTE — ED Provider Notes (Signed)
CSN: [id number]     Arrival date & time 08/22/14  5621 History   First MD Initiated Contact with Patient 08/22/14 0940     Chief Complaint  Patient presents with  . Dysuria   (Consider location/radiation/quality/duration/timing/severity/associated sxs/prior Treatment) HPI           19 year old female presents complaining of vaginal irritation, dysuria, urinary frequency. Her symptoms started a few days ago. Denies any abdominal pain or flank pain. She had vaginal discharge a few days ago but that is better. She notes that she was treated for BV and Trichomonas one month ago, her partner was seen and he tested negative and was not treated. They have had unprotected intercourse since she was seen again 2 weeks ago so she is concerned about possible STD even though he tested negative. She denies any pelvic pain.  Past Medical History  Diagnosis Date  . Medical history non-contributory    Past Surgical History  Procedure Laterality Date  . No past surgeries     Family History  Problem Relation Age of Onset  . Hyperlipidemia Sister    History  Substance Use Topics  . Smoking status: Never Smoker   . Smokeless tobacco: Not on file  . Alcohol Use: No   OB History    Gravida Para Term Preterm AB TAB SAB Ectopic Multiple Living       Review of Systems  Genitourinary: Positive for dysuria, urgency, frequency and vaginal discharge. Negative for pelvic pain.  All other systems reviewed and are negative.   Allergies  Review of patient's allergies indicates no known allergies.  Home Medications   Prior to Admission medications   Medication Sig Start Date End Date Taking? Authorizing Provider  cefUROXime (CEFTIN) 250 MG tablet Take 1 tablet (250 mg total) by mouth 2 (two) times daily with a meal. 08/22/14   Graylon Good, PA-C  cephALEXin (KEFLEX) 500 MG capsule Take 1 capsule (500 mg total) by mouth 2 (two) times daily. 05/10/14   Rodolph Bong, MD  fluconazole  (DIFLUCAN) 150 MG tablet Take 1 tablet (150 mg total) by mouth daily. 08/07/14   Duane Lope, NP  fluconazole (DIFLUCAN) 150 MG tablet Take 1 tablet (150 mg total) by mouth once. Pick up the refill and and take second dose in 5 days if symptoms have not resolved 08/22/14   Graylon Good, PA-C  ibuprofen (ADVIL,MOTRIN) 200 MG tablet Take 800 mg by mouth every 6 (six) hours as needed for headache or moderate pain.    Historical Provider, MD  naproxen (NAPROSYN) 500 MG tablet Take 1 tablet (500 mg total) by mouth 2 (two) times daily with a meal. 08/16/13   Eber Hong, MD   BP 113/71 mmHg  Pulse 93  Temp(Src) 98.2 F (36.8 C) (Oral)  Resp 16  SpO2 96%  LMP 08/01/2014 Physical Exam  Constitutional: She is oriented to person, place, and time. Vital signs are normal. She appears well-developed and well-nourished. No distress.  HENT:  Head: Normocephalic and atraumatic.  Pulmonary/Chest: Effort normal. No respiratory distress.  Abdominal: Soft. There is no tenderness. There is no rebound and no guarding.  Genitourinary: Vaginal discharge (thin, white) found.  Neurological: She is alert and oriented to person, place, and time. She has normal strength. Coordination normal.  Skin: Skin is warm and dry. No rash noted. She is not diaphoretic.  Psychiatric: She has a normal mood and affect. Judgment normal.  Nursing note and vitals reviewed.   ED Course  Procedures (including critical care time) Labs Review Labs Reviewed  POCT URINALYSIS DIP (DEVICE) - Abnormal; Notable for the following:    Hgb urine dipstick TRACE (*)    Leukocytes, UA TRACE (*)    All other components within normal limits  URINE CULTURE  POCT PREGNANCY, URINE  CERVICOVAGINAL ANCILLARY ONLY    Imaging Review No results found.   MDM   1. UTI (lower urinary tract infection)    Cytology swabs sent. Treat for UTI. Urine culture was also sent. Will call with any positive STD results.   Meds ordered this  encounter  Medications  . cefUROXime (CEFTIN) 250 MG tablet    Sig: Take 1 tablet (250 mg total) by mouth 2 (two) times daily with a meal.    Dispense:  10 tablet    Refill:  0  . fluconazole (DIFLUCAN) 150 MG tablet    Sig: Take 1 tablet (150 mg total) by mouth once. Pick up the refill and and take second dose in 5 days if symptoms have not resolved    Dispense:  1 tablet    Refill:  1       Graylon GoodZachary H Aarthi Uyeno, PA-C 08/22/14 1023

## 2014-08-22 NOTE — ED Notes (Signed)
Reports burning with urinating and urinary urgency. On set yesterday.  No otc treatments tried.

## 2014-08-23 LAB — CERVICOVAGINAL ANCILLARY ONLY
Chlamydia: NEGATIVE
Neisseria Gonorrhea: NEGATIVE
Wet Prep (BD Affirm): POSITIVE — AB

## 2014-08-24 LAB — URINE CULTURE: Colony Count: 70000

## 2014-08-25 ENCOUNTER — Telehealth (HOSPITAL_COMMUNITY): Payer: Self-pay | Admitting: Family Medicine

## 2014-08-25 MED ORDER — METRONIDAZOLE 500 MG PO TABS: 500.0000 mg | ORAL_TABLET | Freq: Two times a day (BID) | ORAL | Status: DC

## 2014-08-25 NOTE — ED Notes (Signed)
Cytology positive for BV and Trichomonas. Metronidazole called in. Patient notified. Patient asked to inform sexual partners regarding Trichomonas.  Rodolph BongEvan S Shailee Foots, MD 08/25/14 [phone]

## 2014-09-08 ENCOUNTER — Emergency Department (INDEPENDENT_AMBULATORY_CARE_PROVIDER_SITE_OTHER)
Admission: EM | Admit: 2014-09-08 | Discharge: 2014-09-08 | Disposition: A | Discharge status: Home / Self Care | Payer: Medicaid Other | Source: Home / Self Care | Attending: Emergency Medicine | Admitting: Emergency Medicine

## 2014-09-08 ENCOUNTER — Encounter (HOSPITAL_COMMUNITY): Payer: Self-pay | Admitting: *Deleted

## 2014-09-08 ENCOUNTER — Other Ambulatory Visit (HOSPITAL_COMMUNITY)
Admission: RE | Admit: 2014-09-08 | Discharge: 2014-09-08 | Disposition: A | Discharge status: Home / Self Care | Payer: Medicaid Other | Source: Ambulatory Visit | Attending: Emergency Medicine | Admitting: Emergency Medicine

## 2014-09-08 DIAGNOSIS — Z113 Encounter for screening for infections with a predominantly sexual mode of transmission: Secondary | ICD-10-CM | POA: Insufficient documentation

## 2014-09-08 DIAGNOSIS — N760 Acute vaginitis: Secondary | ICD-10-CM | POA: Diagnosis present

## 2014-09-08 DIAGNOSIS — Z202 Contact with and (suspected) exposure to infections with a predominantly sexual mode of transmission: Secondary | ICD-10-CM

## 2014-09-08 LAB — POCT URINALYSIS DIP (DEVICE)
Bilirubin Urine: NEGATIVE
Glucose, UA: NEGATIVE mg/dL
Hgb urine dipstick: NEGATIVE
KETONES UR: NEGATIVE mg/dL
LEUKOCYTES UA: NEGATIVE
Nitrite: NEGATIVE
PH: 7 (ref 5.0–8.0)
PROTEIN: NEGATIVE mg/dL
Specific Gravity, Urine: 1.025 (ref 1.005–1.030)
UROBILINOGEN UA: 0.2 mg/dL (ref 0.0–1.0)

## 2014-09-08 LAB — POCT PREGNANCY, URINE: Preg Test, Ur: NEGATIVE

## 2014-09-08 NOTE — ED Notes (Signed)
Pt  States  Was  Treated for  Std  sev  Weeks  Ago  She  Wants  To  Be  Rechecked       She  Reports  Vaginal irritation  Possible  Discharge   But  Unclear      She  Also reports   Her     r  Knee  Checked  As  She  Is  A  Jumper  And  Has  Pain r  Knee  When she  [employer]

## 2014-09-08 NOTE — Discharge Instructions (Signed)
Repeat cervicovaginal test results pending Recent records reviewed and syphilis and HIV tests negative Will notify by phone if repeat testing results indicate the need for additional treatment.  May continue with Diflucan as previously prescribed. Follow up with primary care provider if additional concerns.  Safe sex practices using condoms Safe Sex Safe sex is about reducing the risk of giving or getting a sexually transmitted disease (STD). STDs are spread through sexual contact involving the genitals, mouth, or rectum. Some STDs can be cured and others cannot. Safe sex can also prevent unintended pregnancies.  WHAT ARE SOME SAFE SEX PRACTICES?  Limit your sexual activity to only one partner who is having sex with only you.  Talk to your partner about his or her past partners, past STDs, and drug use.  Use a condom every time you have sexual intercourse. This includes vaginal, oral, and anal sexual activity. Both females and males should wear condoms during oral sex. Only use latex or polyurethane condoms and water-based lubricants. Using petroleum-based lubricants or oils to lubricate a condom will weaken the condom and increase the chance that it will break. The condom should be in place from the beginning to the end of sexual activity. Wearing a condom reduces, but does not completely eliminate, your risk of getting or giving an STD. STDs can be spread by contact with infected body fluids and skin.  Get vaccinated for hepatitis B and HPV.  Avoid alcohol and recreational drugs, which can affect your judgment. You may forget to use a condom or participate in high-risk sex.  For females, avoid douching after sexual intercourse. Douching can spread an infection farther into the reproductive tract.  Check your body for signs of sores, blisters, rashes, or unusual discharge. See your health care provider if you notice any of these signs.  Avoid sexual contact if you have symptoms of an  infection or are being treated for an STD. If you or your partner has herpes, avoid sexual contact when blisters are present. Use condoms at all other times.  If you are at risk of being infected with HIV, it is recommended that you take a prescription medicine daily to prevent HIV infection. This is called pre-exposure prophylaxis (PrEP). You are considered at risk if:  You are a man who has sex with other men (MSM).  You are a heterosexual man or woman who is sexually active with more than one partner.  You take drugs by injection.  You are sexually active with a partner who has HIV.  Talk with your health care provider about whether you are at high risk of being infected with HIV. If you choose to begin PrEP, you should first be tested for HIV. You should then be tested every 3 months for as long as you are taking PrEP.  See your health care provider for regular screenings, exams, and tests for other STDs. Before having sex with a new partner, each of you should be screened for STDs and should talk about the results with each other. WHAT ARE THE BENEFITS OF SAFE SEX?   There is less chance of getting or giving an STD.  You can prevent unwanted or unintended pregnancies.  By discussing safe sex concerns with your partner, you may increase feelings of intimacy, comfort, trust, and honesty between the two of you. Document Released: 06/21/2004 Document Revised: 09/28/2013 Document Reviewed: 11/05/2011 [facility] Patient Information 2015 [place], [place]. This information is not intended to replace advice given to you by your health  care provider. Make sure you discuss any questions you have with your health care provider.  Sexually Transmitted Disease A sexually transmitted disease (STD) is a disease or infection that may be passed (transmitted) from person to person, usually during sexual activity. This may happen by way of saliva, semen, blood, vaginal mucus, or urine. Common STDs include:     Gonorrhea.   Chlamydia.   Syphilis.   HIV and AIDS.   Genital herpes.   Hepatitis B and C.   Trichomonas.   Human papillomavirus (HPV).   Pubic lice.   Scabies.  Mites.  Bacterial vaginosis. WHAT ARE CAUSES OF STDs? An STD may be caused by bacteria, a virus, or parasites. STDs are often transmitted during sexual activity if one person is infected. However, they may also be transmitted through nonsexual means. STDs may be transmitted after:   Sexual intercourse with an infected person.   Sharing sex toys with an infected person.   Sharing needles with an infected person or using unclean piercing or tattoo needles.  Having intimate contact with the genitals, mouth, or rectal areas of an infected person.   Exposure to infected fluids during birth. WHAT ARE THE SIGNS AND SYMPTOMS OF STDs? Different STDs have different symptoms. Some people may not have any symptoms. If symptoms are present, they may include:   Painful or bloody urination.   Pain in the pelvis, abdomen, vagina, anus, throat, or eyes.   A skin rash, itching, or irritation.  Growths, ulcerations, blisters, or sores in the genital and anal areas.  Abnormal vaginal discharge with or without bad odor.   Penile discharge in men.   Fever.   Pain or bleeding during sexual intercourse.   Swollen glands in the groin area.   Yellow skin and eyes (jaundice). This is seen with hepatitis.   Swollen testicles.  Infertility.  Sores and blisters in the mouth. HOW ARE STDs DIAGNOSED? To make a diagnosis, your health care provider may:   Take a medical history.   Perform a physical exam.   Take a sample of any discharge to examine.  Swab the throat, cervix, opening to the penis, rectum, or vagina for testing.  Test a sample of your first morning urine.   Perform blood tests.   Perform a Pap test, if this applies.   Perform a colposcopy.   Perform a laparoscopy.   HOW ARE STDs TREATED? Treatment depends on the STD. Some STDs may be treated but not cured.   Chlamydia, gonorrhea, trichomonas, and syphilis can be cured with antibiotic medicine.   Genital herpes, hepatitis, and HIV can be treated, but not cured, with prescribed medicines. The medicines lessen symptoms.   Genital warts from HPV can be treated with medicine or by freezing, burning (electrocautery), or surgery. Warts may come back.   HPV cannot be cured with medicine or surgery. However, abnormal areas may be removed from the cervix, vagina, or vulva.   If your diagnosis is confirmed, your recent sexual partners need treatment. This is true even if they are symptom-free or have a negative culture or evaluation. They should not have sex until their health care providers say it is okay. HOW CAN I REDUCE MY RISK OF GETTING AN STD? Take these steps to reduce your risk of getting an STD:  Use latex condoms, dental dams, and water-soluble lubricants during sexual activity. Do not use petroleum jelly or oils.  Avoid having multiple sex partners.  Do not have sex with someone who has  other sex partners.  Do not have sex with anyone you do not know or who is at high risk for an STD.  Avoid risky sex practices that can break your skin.  Do not have sex if you have open sores on your mouth or skin.  Avoid drinking too much alcohol or taking illegal drugs. Alcohol and drugs can affect your judgment and put you in a vulnerable position.  Avoid engaging in oral and anal sex acts.  Get vaccinated for HPV and hepatitis. If you have not received these vaccines in the past, talk to your health care provider about whether one or both might be right for you.   If you are at risk of being infected with HIV, it is recommended that you take a prescription medicine daily to prevent HIV infection. This is called pre-exposure prophylaxis (PrEP). You are considered at risk if:  You are a man who has  sex with other men (MSM).  You are a heterosexual man or woman and are sexually active with more than one partner.  You take drugs by injection.  You are sexually active with a partner who has HIV.  Talk with your health care provider about whether you are at high risk of being infected with HIV. If you choose to begin PrEP, you should first be tested for HIV. You should then be tested every 3 months for as long as you are taking PrEP.  WHAT SHOULD I DO IF I THINK I HAVE AN STD?  See your health care provider.   Tell your sexual partner(s). They should be tested and treated for any STDs.  Do not have sex until your health care provider says it is okay. WHEN SHOULD I GET IMMEDIATE MEDICAL CARE? Contact your health care provider right away if:   You have severe abdominal pain.  You are a man and notice swelling or pain in your testicles.  You are a woman and notice swelling or pain in your vagina. Document Released: 08/04/2002 Document Revised: 05/19/2013 Document Reviewed: 12/02/2012 [facility] Patient Information 2015 [place]. This information is not intended to replace advice given to you by your health care provider. Make sure you discuss any questions you have with your health care provider.  Vaginitis Vaginitis is an inflammation of the vagina. It is most often caused by a change in the normal balance of the bacteria and yeast that live in the vagina. This change in balance causes an overgrowth of certain bacteria or yeast, which causes the inflammation. There are different types of vaginitis, but the most common types are:  Bacterial vaginosis.  Yeast infection (candidiasis).  Trichomoniasis vaginitis. This is a sexually transmitted infection (STI).  Viral vaginitis.  Atropic vaginitis.  Allergic vaginitis. CAUSES  The cause depends on the type of vaginitis. Vaginitis can be caused by:  Bacteria (bacterial vaginosis).  Yeast (yeast infection).  A parasite  (trichomoniasis vaginitis)  A virus (viral vaginitis).  Low hormone levels (atrophic vaginitis). Low hormone levels can occur during pregnancy, breastfeeding, or after menopause.  Irritants, such as bubble baths, scented tampons, and feminine sprays (allergic vaginitis). Other factors can change the normal balance of the yeast and bacteria that live in the vagina. These include:  Antibiotic medicines.  Poor hygiene.  Diaphragms, vaginal sponges, spermicides, birth control pills, and intrauterine devices (IUD).  Sexual intercourse.  Infection.  Uncontrolled diabetes.  A weakened immune system. SYMPTOMS  Symptoms can vary depending on the cause of the vaginitis. Common symptoms include:  Abnormal vaginal discharge.  The discharge is white, gray, or yellow with bacterial vaginosis.  The discharge is thick, white, and cheesy with a yeast infection.  The discharge is frothy and yellow or greenish with trichomoniasis.  A bad vaginal odor.  The odor is fishy with bacterial vaginosis.  Vaginal itching, pain, or swelling.  Painful intercourse.  Pain or burning when urinating. Sometimes, there are no symptoms. TREATMENT  Treatment will vary depending on the type of infection.   Bacterial vaginosis and trichomoniasis are often treated with antibiotic creams or pills.  Yeast infections are often treated with antifungal medicines, such as vaginal creams or suppositories.  Viral vaginitis has no cure, but symptoms can be treated with medicines that relieve discomfort. Your sexual partner should be treated as well.  Atrophic vaginitis may be treated with an estrogen cream, pill, suppository, or vaginal ring. If vaginal dryness occurs, lubricants and moisturizing creams may help. You may be told to avoid scented soaps, sprays, or douches.  Allergic vaginitis treatment involves quitting the use of the product that is causing the problem. Vaginal creams can be used to treat the  symptoms. HOME CARE INSTRUCTIONS   Take all medicines as directed by your caregiver.  Keep your genital area clean and dry. Avoid soap and only rinse the area with water.  Avoid douching. It can remove the healthy bacteria in the vagina.  Do not use tampons or have sexual intercourse until your vaginitis has been treated. Use sanitary pads while you have vaginitis.  Wipe from front to back. This avoids the spread of bacteria from the rectum to the vagina.  Let air reach your genital area.  Wear cotton underwear to decrease moisture buildup.  Avoid wearing underwear while you sleep until your vaginitis is gone.  Avoid tight pants and underwear or nylons without a cotton panel.  Take off wet clothing (especially bathing suits) as soon as possible.  Use mild, non-scented products. Avoid using irritants, such as:  Scented feminine sprays.  Fabric softeners.  Scented detergents.  Scented tampons.  Scented soaps or bubble baths.  Practice safe sex and use condoms. Condoms may prevent the spread of trichomoniasis and viral vaginitis. SEEK MEDICAL CARE IF:   You have abdominal pain.  You have a fever or persistent symptoms for more than 2-3 days.  You have a fever and your symptoms suddenly get worse. Document Released: 03/11/2007 Document Revised: 02/06/2012 Document Reviewed: 10/25/2011 [facility] Patient Information 2015 [place], [place]. This information is not intended to replace advice given to you by your health care provider. Make sure you discuss any questions you have with your health care provider.

## 2014-09-08 NOTE — ED Provider Notes (Signed)
CSN: [id number]     Arrival date & time 09/08/14  0919 History   First MD Initiated Contact with Patient 09/08/14 [phone]     Chief Complaint  Patient presents with  . Exposure to STD   (Consider location/radiation/quality/duration/timing/severity/associated sxs/prior Treatment) HPI Comments: Patient states she was recently treated for BV and trichomonas and returns to request repeat testing to ensure cure/resolution. States she has some mild vaginal itching this morning and has recently taken dose of Diflucan. Has not yet picked up or taken second dose of diflucan that she was advised to take if she developed vaginal itching.  PCP: [facility]: none  Patient is a 19 y.o. female presenting with STD exposure. The history is provided by the patient.  Exposure to STD    Past Medical History  Diagnosis Date  . Medical history non-contributory    Past Surgical History  Procedure Laterality Date  . No past surgeries     Family History  Problem Relation Age of Onset  . Hyperlipidemia Sister    History  Substance Use Topics  . Smoking status: Never Smoker   . Smokeless tobacco: Not on file  . Alcohol Use: No   OB History    Gravida Para Term Preterm AB TAB SAB Ectopic Multiple Living   0 0 0 0 0 0 0 0 0 0      Review of Systems  All other systems reviewed and are negative.   Allergies  Review of patient's allergies indicates no known allergies.  Home Medications   Prior to Admission medications   Medication Sig Start Date End Date Taking? Authorizing Provider  cefUROXime (CEFTIN) 250 MG tablet Take 1 tablet (250 mg total) by mouth 2 (two) times daily with a meal. 08/22/14   Graylon GoodZachary H Baker, PA-C  cephALEXin (KEFLEX) 500 MG capsule Take 1 capsule (500 mg total) by mouth 2 (two) times daily. 05/10/14   Rodolph BongEvan S Corey, MD  fluconazole (DIFLUCAN) 150 MG tablet Take 1 tablet (150 mg total) by mouth daily. 08/07/14   Duane LopeJennifer I Rasch, NP  fluconazole (DIFLUCAN) 150 MG tablet  Take 1 tablet (150 mg total) by mouth once. Pick up the refill and and take second dose in 5 days if symptoms have not resolved 08/22/14   Graylon GoodZachary H Baker, PA-C  ibuprofen (ADVIL,MOTRIN) 200 MG tablet Take 800 mg by mouth every 6 (six) hours as needed for headache or moderate pain.    Historical Provider, MD  metroNIDAZOLE (FLAGYL) 500 MG tablet Take 1 tablet (500 mg total) by mouth 2 (two) times daily. 08/25/14   Rodolph BongEvan S Corey, MD  naproxen (NAPROSYN) 500 MG tablet Take 1 tablet (500 mg total) by mouth 2 (two) times daily with a meal. 08/16/13   Eber HongBrian Miller, MD   BP 112/65 mmHg  Pulse 76  Temp(Src) 98.2 F (36.8 C) (Oral)  Resp 76  SpO2 100%  LMP 08/30/2014 Physical Exam  Constitutional: She is oriented to person, place, and time. She appears well-developed and well-nourished. No distress.  HENT:  Head: Normocephalic and atraumatic.  Cardiovascular: Normal rate.   Pulmonary/Chest: Effort normal.  Genitourinary: Uterus normal. Pelvic exam was performed with patient supine. There is no rash, tenderness or lesion on the right labia. There is no rash, tenderness or lesion on the left labia. Cervix exhibits no motion tenderness, no discharge and no friability. Right adnexum displays no mass, no tenderness and no fullness. Left adnexum displays no mass, no tenderness and no fullness. No erythema, tenderness  or bleeding in the vagina. No foreign body around the vagina. No signs of injury around the vagina. No vaginal discharge found.  Musculoskeletal: Normal range of motion.  Neurological: She is alert and oriented to person, place, and time.  Skin: Skin is warm and dry.  Psychiatric: She has a normal mood and affect. Her behavior is normal.  Nursing note and vitals reviewed.   ED Course  Procedures (including critical care time) Labs Review Labs Reviewed  POCT URINALYSIS DIP (DEVICE)  POCT PREGNANCY, URINE  CERVICOVAGINAL ANCILLARY ONLY    Imaging Review No results found.   MDM   1.  Exposure to STD    Repeat cervicovaginal testing pend Recent records reviewed and RPR and HIV negative Will notify by phone if repeat testing results indicate the need for additional treatment.  May continue with Diflucan as previously prescribed. Follow up with PCP if additional concerns.  Safe sex practices using condoms    Ria Clock, [place] 09/08/14 1021

## 2014-09-09 LAB — CERVICOVAGINAL ANCILLARY ONLY
Chlamydia: NEGATIVE
NEISSERIA GONORRHEA: NEGATIVE

## 2014-09-10 LAB — CERVICOVAGINAL ANCILLARY ONLY: Wet Prep (BD Affirm): NEGATIVE

## 2014-09-14 ENCOUNTER — Emergency Department (INDEPENDENT_AMBULATORY_CARE_PROVIDER_SITE_OTHER)
Admission: EM | Admit: 2014-09-14 | Discharge: 2014-09-14 | Disposition: A | Discharge status: Home / Self Care | Payer: Medicaid Other | Source: Home / Self Care | Attending: Emergency Medicine | Admitting: Emergency Medicine

## 2014-09-14 ENCOUNTER — Encounter (HOSPITAL_COMMUNITY): Payer: Self-pay | Admitting: Emergency Medicine

## 2014-09-14 DIAGNOSIS — B349 Viral infection, unspecified: Secondary | ICD-10-CM

## 2014-09-14 NOTE — ED Provider Notes (Signed)
CSN: [id number]     Arrival date & time 09/14/14  1742 History   First MD Initiated Contact with Patient 09/14/14 1907     Chief Complaint  Patient presents with  . Headache  . Back Pain  . Sore Throat  . Generalized Body Aches   (Consider location/radiation/quality/duration/timing/severity/associated sxs/prior Treatment) HPI Comments: 19 year old female states she awoke this morning with a bad headache associated with sore throat and body aches. She also lists weights and runs and has been increasing her activity in this regard for the past few days as well. On arrival to the urgent care she discovered showed temperature 100.2 degrees. She is also having PND but no runny nose. She is not taking any medications. Denies abdominal pain, nausea, vomiting or diarrhea.   Past Medical History  Diagnosis Date  . Medical history non-contributory    Past Surgical History  Procedure Laterality Date  . No past surgeries     Family History  Problem Relation Age of Onset  . Hyperlipidemia Sister    History  Substance Use Topics  . Smoking status: Never Smoker   . Smokeless tobacco: Not on file  . Alcohol Use: No   OB History    Gravida Para Term Preterm AB TAB SAB Ectopic Multiple Living   0 0 0 0 0 0 0 0 0 0      Review of Systems  Constitutional: Positive for activity change. Negative for fever and fatigue.  HENT: Positive for sore throat. Negative for ear pain, postnasal drip and rhinorrhea.   Respiratory: Negative for cough, shortness of breath and wheezing.   Cardiovascular: Negative for chest pain and palpitations.  Gastrointestinal: Negative.   Genitourinary: Negative.   Skin: Negative.     Allergies  Review of patient's allergies indicates no known allergies.  Home Medications   Prior to Admission medications   Medication Sig Start Date End Date Taking? Authorizing Provider  ibuprofen (ADVIL,MOTRIN) 200 MG tablet Take 800 mg by mouth every 6 (six) hours as needed for  headache or moderate pain.    Historical Provider, MD   BP 107/69 mmHg  Pulse 88  Temp(Src) 100.2 F (37.9 C) (Oral)  Resp 12  SpO2 100%  LMP 08/30/2014 Physical Exam  Constitutional: She is oriented to person, place, and time. She appears well-developed and well-nourished. No distress.  HENT:  Mouth/Throat: No oropharyngeal exudate.  Bilateral TMs are normal Oropharynx with mild erythema, cobblestoning and clear PND  Eyes: Conjunctivae and EOM are normal. Right eye exhibits no discharge. Left eye exhibits no discharge.  Neck: Normal range of motion.  Cardiovascular: Normal rate, normal heart sounds and intact distal pulses.   Pulmonary/Chest: Effort normal and breath sounds normal. No respiratory distress. She has no wheezes. She has no rales.  Musculoskeletal: She exhibits no edema.  Tenderness to the shoulder and back musculature. Also tenderness to the abdominal musculature.  Lymphadenopathy:    She has no cervical adenopathy.  Neurological: She is alert and oriented to person, place, and time.  Skin: Skin is warm.  Psychiatric: She has a normal mood and affect.  Vitals reviewed.   ED Course  Procedures (including critical care time) Labs Review Labs Reviewed - No data to display  Imaging Review No results found.   MDM   1. Viral syndrome    Lots of fluids, zyrtec or allegra for drainage Ibuprofen or tylenol for fever/aches Rest, no track for 3 days     Hayden Rasmussenavid Dezman Granda, NP 09/14/14 1929

## 2014-09-14 NOTE — ED Notes (Signed)
Pt states she woke up this morning with her back and shoulders hurting, also with a sore throat and headache.  She states she thought she was sore from lifting weights, but then said she was very stiff all day.  She is not sure if she has had a fever today.

## 2014-09-14 NOTE — Discharge Instructions (Signed)
Viral Infections Lots of fluids, zyrtec or allegra for drainage Ibuprofen or tylenol for fever/aches Rest, no track for 3 days A viral infection can be caused by different types of viruses.Most viral infections are not serious and resolve on their own. However, some infections may cause severe symptoms and may lead to further complications. SYMPTOMS Viruses can frequently cause:  Minor sore throat.  Aches and pains.  Headaches.  Runny nose.  Different types of rashes.  Watery eyes.  Tiredness.  Cough.  Loss of appetite.  Gastrointestinal infections, resulting in nausea, vomiting, and diarrhea. These symptoms do not respond to antibiotics because the infection is not caused by bacteria. However, you might catch a bacterial infection following the viral infection. This is sometimes called a "superinfection." Symptoms of such a bacterial infection may include:  Worsening sore throat with pus and difficulty swallowing.  Swollen neck glands.  Chills and a high or persistent fever.  Severe headache.  Tenderness over the sinuses.  Persistent overall ill feeling (malaise), muscle aches, and tiredness (fatigue).  Persistent cough.  Yellow, green, or brown mucus production with coughing. HOME CARE INSTRUCTIONS   Only take over-the-counter or prescription medicines for pain, discomfort, diarrhea, or fever as directed by your caregiver.  Drink enough water and fluids to keep your urine clear or pale yellow. Sports drinks can provide valuable electrolytes, sugars, and hydration.  Get plenty of rest and maintain proper nutrition. Soups and broths with crackers or rice are fine. SEEK IMMEDIATE MEDICAL CARE IF:   You have severe headaches, shortness of breath, chest pain, neck pain, or an unusual rash.  You have uncontrolled vomiting, diarrhea, or you are unable to keep down fluids.  You or your child has an oral temperature above 102 F (38.9 C), not controlled by  medicine.  Your baby is older than 3 months with a rectal temperature of 102 F (38.9 C) or higher.  Your baby is 563 months old or younger with a rectal temperature of 100.4 F (38 C) or higher. MAKE SURE YOU:   Understand these instructions.  Will watch your condition.  Will get help right away if you are not doing well or get worse. Document Released: 02/21/2005 Document Revised: 08/06/2011 Document Reviewed: 09/18/2010 [facility] Patient Information 2015 [employer], [place]. This information is not intended to replace advice given to you by your health care provider. Make sure you discuss any questions you have with your health care provider.

## 2014-10-06 ENCOUNTER — Other Ambulatory Visit: Payer: Self-pay | Admitting: General Surgery

## 2014-10-25 ENCOUNTER — Encounter (HOSPITAL_COMMUNITY): Payer: Self-pay | Admitting: Emergency Medicine

## 2014-10-25 ENCOUNTER — Emergency Department (HOSPITAL_COMMUNITY): Payer: Medicaid Other

## 2014-10-25 DIAGNOSIS — R0602 Shortness of breath: Secondary | ICD-10-CM | POA: Diagnosis not present

## 2014-10-25 DIAGNOSIS — R0789 Other chest pain: Secondary | ICD-10-CM | POA: Diagnosis not present

## 2014-10-25 DIAGNOSIS — R079 Chest pain, unspecified: Secondary | ICD-10-CM | POA: Diagnosis present

## 2014-10-25 LAB — CBC
HCT: 33.5 % — ABNORMAL LOW (ref 36.0–46.0)
HEMOGLOBIN: 10.6 g/dL — AB (ref 12.0–15.0)
MCH: 24.5 pg — AB (ref 26.0–34.0)
MCHC: 31.6 g/dL (ref 30.0–36.0)
MCV: 77.4 fL — ABNORMAL LOW (ref 78.0–100.0)
PLATELETS: 332 10*3/uL (ref 150–400)
RBC: 4.33 MIL/uL (ref 3.87–5.11)
RDW: 17.6 % — AB (ref 11.5–15.5)
WBC: 7 10*3/uL (ref 4.0–10.5)

## 2014-10-25 LAB — I-STAT TROPONIN, ED: Troponin i, poc: 0.02 ng/mL (ref 0.00–0.08)

## 2014-10-25 NOTE — ED Notes (Signed)
Pt. reports left chest pain onset last week , denies SOB , no nausea or diaphoresis , pt. states pain increases when lying  and certain positions .

## 2014-10-26 ENCOUNTER — Emergency Department (HOSPITAL_COMMUNITY)
Admission: EM | Admit: 2014-10-26 | Discharge: 2014-10-26 | Disposition: A | Discharge status: Home / Self Care | Payer: Medicaid Other | Attending: Emergency Medicine | Admitting: Emergency Medicine

## 2014-10-26 DIAGNOSIS — R0789 Other chest pain: Secondary | ICD-10-CM

## 2014-10-26 LAB — BASIC METABOLIC PANEL
ANION GAP: 5 (ref 5–15)
BUN: 17 mg/dL (ref 6–20)
CO2: 25 mmol/L (ref 22–32)
CREATININE: 0.89 mg/dL (ref 0.44–1.00)
Calcium: 9.1 mg/dL (ref 8.9–10.3)
Chloride: 109 mmol/L (ref 101–111)
GFR calc non Af Amer: >60 mL/min (ref >60)
Glucose, Bld: 100 mg/dL — ABNORMAL HIGH (ref 65–99)
Potassium: 3.8 mmol/L (ref 3.5–5.1)
Sodium: 139 mmol/L (ref 135–145)

## 2014-10-26 MED ORDER — IBUPROFEN 400 MG PO TABS: 400.0000 mg | ORAL_TABLET | Freq: Four times a day (QID) | ORAL | Status: DC | PRN

## 2014-10-26 NOTE — ED Provider Notes (Signed)
CSN: [id number]     Arrival date & time 10/25/14  2311 History  This chart was scribed for Derwood Kaplan, MD by Annye Asa, ED Scribe. This patient was seen in room D30C/D30C and the patient's care was started at 12:45 AM.     Chief Complaint  Patient presents with  . Chest Pain   The history is provided by the patient. No language interpreter was used.    HPI Comments: Emma Thompson is an otherwise healthy 19 y.o. female who presents to the Emergency Department complaining of one week of constant, non-radiating, "cramping," left-sided chest pain, worsening acutely over the last three days and exacerbated with bending, lying supine. Her pain is unaffected by applied pressure. She also notes mild SOB. Patient reports she had a surgery to remove fibrous tissue from her breast on 10/06/14. Patient was given hydrocodone post-op, which she took for two days; no other treatments or medications tried PTA. She denies nausea, diaphoresis, any bleeding or swelling to the area. She denies any recent known trauma. She denies any personal history of blood clots, denies exogenous estrogen, denies any recent long-term periods of immobility (e.g. no plane or car trips over 6-8 hours). She denies any familial cardiac history.   Patient denies illicit drug use.   Past Medical History  Diagnosis Date  . Medical history non-contributory    Past Surgical History  Procedure Laterality Date  . No past surgeries    . Breast surgery     Family History  Problem Relation Age of Onset  . Hyperlipidemia Sister    History  Substance Use Topics  . Smoking status: Never Smoker   . Smokeless tobacco: Not on file  . Alcohol Use: No   OB History    Gravida Para Term Preterm AB TAB SAB Ectopic Multiple Living       Review of Systems  Constitutional: Negative for diaphoresis.  Respiratory: Positive for shortness of breath.   Cardiovascular: Positive for chest pain.  Gastrointestinal:  Negative for nausea.  All other systems reviewed and are negative.  Allergies  Review of patient's allergies indicates no known allergies.  Home Medications   Prior to Admission medications   Medication Sig Start Date End Date Taking? Authorizing Provider  ibuprofen (ADVIL,MOTRIN) 400 MG tablet Take 1 tablet (400 mg total) by mouth every 6 (six) hours as needed. 10/26/14   Graysen Depaula, MD   BP 104/57 mmHg  Pulse 62  Temp(Src) 98.4 F (36.9 C) (Oral)  Resp 14  Ht 5' 6.5" (1.689 m)  Wt 132 lb (59.875 kg)  BMI 20.99 kg/m2  SpO2 99%  LMP 09/29/2014 Physical Exam  Constitutional: She is oriented to person, place, and time. She appears well-developed and well-nourished. No distress.  HENT:  Head: Normocephalic and atraumatic.  Mouth/Throat: Oropharynx is clear and moist. No oropharyngeal exudate.  Moist mucous membranes  Eyes: EOM are normal. Pupils are equal, round, and reactive to light.  Neck: Normal range of motion. Neck supple. No JVD present.  Cardiovascular: Normal rate, regular rhythm and normal heart sounds.  Exam reveals no gallop and no friction rub.   No murmur heard. Pulmonary/Chest: Effort normal and breath sounds normal. No respiratory distress. She has no wheezes. She has no rales.  Abdominal: Soft. Bowel sounds are normal. She exhibits no mass. There is no tenderness. There is no rebound and no guarding.  Musculoskeletal: Normal range of motion. She exhibits no edema (No  pitting edema) or tenderness (No unilateral calf swelling or tenderness).  Moves all extremities normally.   Lymphadenopathy:    She has no cervical adenopathy.  Neurological: She is alert and oriented to person, place, and time. She displays normal reflexes.  Skin: Skin is warm and dry. No rash noted.  Psychiatric: She has a normal mood and affect. Her behavior is normal.  Nursing note and vitals reviewed.  ED Course  Procedures   DIAGNOSTIC STUDIES: Oxygen Saturation is 99% on RA, normal  by my interpretation.    COORDINATION OF CARE: 1:02 AM Discussed treatment plan with pt at bedside and pt agreed to plan.   Labs Review Labs Reviewed  CBC - Abnormal; Notable for the following:    Hemoglobin 10.6 (*)    HCT 33.5 (*)    MCV 77.4 (*)    MCH 24.5 (*)    RDW 17.6 (*)    All other components within normal limits  BASIC METABOLIC PANEL - Abnormal; Notable for the following:    Glucose, Bld 100 (*)    All other components within normal limits  I-STAT TROPOININ, ED    Imaging Review Dg Chest 2 View  10/26/2014   CLINICAL DATA:  Worsening pain behind the left breast, worse with inspiration, status post left-sided lumpectomy. Initial encounter.  EXAM: CHEST  2 VIEW  COMPARISON:  None.  FINDINGS: The lungs are well-aerated and clear. There is no evidence of focal opacification, pleural effusion or pneumothorax.  The heart is normal in size; the mediastinal contour is within normal limits. No acute osseous abnormalities are seen.  IMPRESSION: No acute cardiopulmonary process seen.   Electronically Signed   By: Roanna RaiderJeffery  Chang M.D.   On: 10/26/2014 00:45     EKG Interpretation   Date/Time:  Monday Oct 25 2014 23:18:38 EDT Ventricular Rate:  77 PR Interval:  126 QRS Duration: 80 QT Interval:  344 QTC Calculation: 389 R Axis:   76 Text Interpretation:  Normal sinus rhythm with sinus arrhythmia  Nonspecific T wave abnormality Abnormal ECG No acute changes No old  tracing to compare Confirmed by Rhunette CroftNANAVATI, MD, Janey GentaANKIT [phone]([id number]) on 10/26/2014  12:23:27 AM      MDM   Final diagnoses:  Atypical chest pain    I personally performed the services described in this documentation, which was scribed in my presence. The recorded information has been reviewed and is accurate.  Pt with cc of chest pain. L sided - atypical. She has this t wave inversion - that i am not sure what accounts for that. The pain certainly is not typical from heart. No pericarditis per ekg either. Advise  pcp f/u, if she doesn't have a pcp, i have provided cards #.    Derwood KaplanAnkit Marquie Aderhold, MD 10/26/14 [phone]

## 2014-10-26 NOTE — Discharge Instructions (Signed)
We saw you in the ER for the chest pain/shortness of breath. °All of our cardiac workup is normal, including labs, EKG and chest X-RAY are normal. °We are not sure what is causing your discomfort, but we feel comfortable sending you home at this time. The workup in the ER is not complete, and you should follow up with your primary care doctor for further evaluation. ° ° °Chest Pain (Nonspecific) °It is often hard to give a specific diagnosis for the cause of chest pain. There is always a chance that your pain could be related to something serious, such as a heart attack or a blood clot in the lungs. You need to follow up with your health care provider for further evaluation. °CAUSES  °· Heartburn. °· Pneumonia or bronchitis. °· Anxiety or stress. °· Inflammation around your heart (pericarditis) or lung (pleuritis or pleurisy). °· A blood clot in the lung. °· A collapsed lung (pneumothorax). It can develop suddenly on its own (spontaneous pneumothorax) or from trauma to the chest. °· Shingles infection (herpes zoster virus). °The chest wall is composed of bones, muscles, and cartilage. Any of these can be the source of the pain. °· The bones can be bruised by injury. °· The muscles or cartilage can be strained by coughing or overwork. °· The cartilage can be affected by inflammation and become sore (costochondritis). °DIAGNOSIS  °Lab tests or other studies may be needed to find the cause of your pain. Your health care provider may have you take a test called an ambulatory electrocardiogram (ECG). An ECG records your heartbeat patterns over a 24-hour period. You may also have other tests, such as: °· Transthoracic echocardiogram (TTE). During echocardiography, sound waves are used to evaluate how blood flows through your heart. °· Transesophageal echocardiogram (TEE). °· Cardiac monitoring. This allows your health care provider to monitor your heart rate and rhythm in real time. °· Holter monitor. This is a portable  device that records your heartbeat and can help diagnose heart arrhythmias. It allows your health care provider to track your heart activity for several days, if needed. °· Stress tests by exercise or by giving medicine that makes the heart beat faster. °TREATMENT  °· Treatment depends on what may be causing your chest pain. Treatment may include: °¨ Acid blockers for heartburn. °¨ Anti-inflammatory medicine. °¨ Pain medicine for inflammatory conditions. °¨ Antibiotics if an infection is present. °· You may be advised to change lifestyle habits. This includes stopping smoking and avoiding alcohol, caffeine, and chocolate. °· You may be advised to keep your head raised (elevated) when sleeping. This reduces the chance of acid going backward from your stomach into your esophagus. °Most of the time, nonspecific chest pain will improve within 2-3 days with rest and mild pain medicine.  °HOME CARE INSTRUCTIONS  °· If antibiotics were prescribed, take them as directed. Finish them even if you start to feel better. °· For the next few days, avoid physical activities that bring on chest pain. Continue physical activities as directed. °· Do not use any tobacco products, including cigarettes, chewing tobacco, or electronic cigarettes. °· Avoid drinking alcohol. °· Only take medicine as directed by your health care provider. °· Follow your health care provider's suggestions for further testing if your chest pain does not go away. °· Keep any follow-up appointments you made. If you do not go to an appointment, you could develop lasting (chronic) problems with pain. If there is any problem keeping an appointment, call to reschedule. °SEEK   MEDICAL CARE IF:  °· Your chest pain does not go away, even after treatment. °· You have a rash with blisters on your chest. °· You have a fever. °SEEK IMMEDIATE MEDICAL CARE IF:  °· You have increased chest pain or pain that spreads to your arm, neck, jaw, back, or abdomen. °· You have  shortness of breath. °· You have an increasing cough, or you cough up blood. °· You have severe back or abdominal pain. °· You feel nauseous or vomit. °· You have severe weakness. °· You faint. °· You have chills. °This is an emergency. Do not wait to see if the pain will go away. Get medical help at once. Call your local emergency services (911 in [place] Do not drive yourself to the hospital. °MAKE SURE YOU:  °· Understand these instructions. °· Will watch your condition. °· Will get help right away if you are not doing well or get worse. °Document Released: 02/21/2005 Document Revised: 05/19/2013 Document Reviewed: 12/18/2007 °ExitCare® Patient Information ©2015 [employer]. This information is not intended to replace advice given to you by your health care provider. Make sure you discuss any questions you have with your health care provider. ° °

## 2015-06-13 ENCOUNTER — Ambulatory Visit (INDEPENDENT_AMBULATORY_CARE_PROVIDER_SITE_OTHER): Payer: BLUE CROSS/BLUE SHIELD | Admitting: Physician Assistant

## 2015-06-13 VITALS — BP 112/80 | HR 80 | Temp 98.2°F | Resp 16 | Ht 67.0 in | Wt 136.0 lb

## 2015-06-13 DIAGNOSIS — L739 Follicular disorder, unspecified: Secondary | ICD-10-CM | POA: Diagnosis not present

## 2015-06-13 DIAGNOSIS — A499 Bacterial infection, unspecified: Secondary | ICD-10-CM | POA: Diagnosis not present

## 2015-06-13 DIAGNOSIS — B3731 Acute candidiasis of vulva and vagina: Secondary | ICD-10-CM

## 2015-06-13 DIAGNOSIS — B373 Candidiasis of vulva and vagina: Secondary | ICD-10-CM

## 2015-06-13 DIAGNOSIS — B9689 Other specified bacterial agents as the cause of diseases classified elsewhere: Secondary | ICD-10-CM

## 2015-06-13 DIAGNOSIS — N760 Acute vaginitis: Secondary | ICD-10-CM

## 2015-06-13 DIAGNOSIS — N898 Other specified noninflammatory disorders of vagina: Secondary | ICD-10-CM

## 2015-06-13 LAB — POCT WET + KOH PREP
Trich by wet prep: ABSENT
YEAST BY KOH: ABSENT

## 2015-06-13 MED ORDER — FLUCONAZOLE 150 MG PO TABS: 150.0000 mg | ORAL_TABLET | Freq: Once | ORAL | Status: DC

## 2015-06-13 MED ORDER — DOXYCYCLINE HYCLATE 100 MG PO CAPS: 100.0000 mg | ORAL_CAPSULE | Freq: Two times a day (BID) | ORAL | Status: AC

## 2015-06-13 MED ORDER — METRONIDAZOLE 0.75 % VA GEL: 1.0000 | Freq: Every day | VAGINAL | Status: AC

## 2015-06-13 NOTE — Progress Notes (Signed)
[facility] [address], [place] [phone]  Date:  06/13/2015   Name:  Emma Thompson   DOB:  08/19/1995   MRN:  [id number]  PCP:  No PCP Per Patient    Chief Complaint: std check   History of Present Illness:  This is a 20 y.o. female who is presenting for STD check. She has had 3 days of vaginal odor and  a milky discharge. She states the outside of her vagina feels swollen and irritated. No itching. No urinary symptoms. Denies abdominal pain, back pain, fever or chills. She has recurrent BV. [facility] She states she is currently taking metronidazole once a month as prevention. She states her current symptoms are very similar to her BV symptoms except the vaginal irritation is new. She had sex a couple hours before her symptoms started. First time she has had sex in 2 months. Last had full STD testing 1.5 months ago and all negative except BV. Had chlamydia before 2-3 years ago. No other STDs. LMP 06/01/14. Condoms for birth control. Had the copper IUD and got taken out thinking that was why she was having so many BV infections. She runs track. She states she changes her underwear after practice and meets. Sex is generally a trigger for BV. Doesn't usually have problems after her periods. She uses dove unscented soap. Does not put soap in vagina and does not douche. Does not take baths.   Review of Systems:  Review of Systems See HPI  There are no active problems to display for this patient.   Prior to Admission medications   Medication Sig Start Date End Date Taking? Authorizing Provider  ibuprofen (ADVIL,MOTRIN) 400 MG tablet Take 1 tablet (400 mg total) by mouth every 6 (six) hours as needed. 10/26/14  Yes Derwood KaplanAnkit Nanavati, MD    No Known Allergies  Past Surgical History  Procedure Laterality Date  . No past surgeries    . Breast surgery      Social History  Substance Use Topics  . Smoking  status: Never Smoker   . Smokeless tobacco: Never Used  . Alcohol Use: No    Family History  Problem Relation Age of Onset  . Hyperlipidemia Sister     Medication list has been reviewed and updated.  Physical Examination:  Physical Exam  Constitutional: She is oriented to person, place, and time. She appears well-developed and well-nourished. No distress.  HENT:  Head: Normocephalic and atraumatic.  Right Ear: Hearing normal.  Left Ear: Hearing normal.  Nose: Nose normal.  Eyes: Conjunctivae and lids are normal. Right eye exhibits no discharge. Left eye exhibits no discharge. No scleral icterus.  Pulmonary/Chest: Effort normal. No respiratory distress.  Abdominal: Soft. Normal appearance. There is no tenderness.  Genitourinary: Uterus normal. Cervix exhibits no motion tenderness, no discharge and no friability. Right adnexum displays no tenderness and no fullness. Left adnexum displays no tenderness and no fullness. Vaginal discharge (thin, white) found.  Fishy odor Papules over mons pubis and labia majora, assoc with hair follicle, consistent with folliculitis  Musculoskeletal: Normal range of motion.  Neurological: She is alert and oriented to person, place, and time.  Skin: Skin is warm, dry and intact. No lesion and no rash noted.  Psychiatric: She has a normal mood and affect. Her speech is normal and behavior is normal. Thought content normal.   BP 112/80 mmHg  Pulse 80  Temp(Src) 98.2  F (36.8 C) (Oral)  Resp 16  Ht 5\' 7"  (1.702 m)  Wt 136 lb (61.689 kg)  BMI 21.30 kg/m2  SpO2 98%  LMP 06/02/2015 (Exact Date)  Results for orders placed or performed in visit on 06/13/15  POCT Wet + KOH Prep  Result Value Ref Range   Yeast by KOH Absent Present, Absent   Yeast by wet prep Present Present, Absent   WBC by wet prep Few None, Few, Too numerous to count   Clue Cells Wet Prep HPF POC Few (A) None, Too numerous to count   Trich by wet prep Absent Present, Absent    Bacteria Wet Prep HPF POC Few None, Few, Too numerous to count   Epithelial Cells By [employer] ([facility]) Few None, Few, Too numerous to count   RBC,UR,HPF,POC None None RBC/hpf    Assessment and Plan:  1. Folliculitis 2. Yeast vaginitis 3. BV 4. Vaginal discharge Diffuse folliculitis of groin. Will treat with doxy bid. BV and yeast on wet prep. Treat with metrogel and diflucan. Continue with preventative medications prescribed [facility]. Advised buying hypoallergenic latex free condoms since sex seems to be a trigger. G/C pending. Did not do other STD testing since had full testing 1.5 months ago and has only had sex one time since and was protected. Abstain from sex for next 10 days. Return if symptoms not improved in 10 days. - doxycycline (VIBRAMYCIN) 100 MG capsule; Take 1 capsule (100 mg total) by mouth 2 (two) times daily. AVOID EXCESS SUN EXPOSURE WHILE ON THIS MEDICATION  Dispense: 20 capsule; Refill: 0 - fluconazole (DIFLUCAN) 150 MG tablet; Take 1 tablet (150 mg total) by mouth once. Repeat if needed  Dispense: 2 tablet; Refill: 0 - metroNIDAZOLE (METROGEL VAGINAL) 0.75 % vaginal gel; Place 1 Applicatorful vaginally at bedtime.  Dispense: 70 g; Refill: 0 - GC/Chlamydia Probe Amp - POCT Wet + KOH Prep   Roswell Miners. Dyke Brackett, MHS [facility]  06/13/2015

## 2015-06-13 NOTE — Patient Instructions (Signed)
Take doxy twice a day for 10 days for folliculitis Use gel at night for 5 days. Diflucan today and repeat in 3 days. Buy hypoallergenic latex free condoms. No sex for 10 days. Return if symptoms not improving in 10 days.

## 2015-06-14 LAB — GC/CHLAMYDIA PROBE AMP
CT Probe RNA: NOT DETECTED
GC Probe RNA: NOT DETECTED

## 2015-07-27 ENCOUNTER — Other Ambulatory Visit: Payer: Self-pay | Admitting: Family Medicine

## 2015-07-27 DIAGNOSIS — M25569 Pain in unspecified knee: Secondary | ICD-10-CM

## 2015-07-27 MED ORDER — MELOXICAM 15 MG PO TABS: 15.0000 mg | ORAL_TABLET | Freq: Every day | ORAL | Status: DC

## 2015-09-26 ENCOUNTER — Other Ambulatory Visit: Payer: Self-pay | Admitting: Family Medicine

## 2015-09-26 MED ORDER — PREDNISONE 20 MG PO TABS: 60.0000 mg | ORAL_TABLET | Freq: Every day | ORAL | Status: AC

## 2016-01-14 ENCOUNTER — Encounter (HOSPITAL_COMMUNITY): Payer: Self-pay | Admitting: Emergency Medicine

## 2016-01-14 ENCOUNTER — Emergency Department (HOSPITAL_COMMUNITY)
Admission: EM | Admit: 2016-01-14 | Discharge: 2016-01-14 | Disposition: A | Discharge status: Home / Self Care | Payer: Medicaid Other | Attending: Emergency Medicine | Admitting: Emergency Medicine

## 2016-01-14 DIAGNOSIS — Z5321 Procedure and treatment not carried out due to patient leaving prior to being seen by health care provider: Secondary | ICD-10-CM | POA: Insufficient documentation

## 2016-01-14 DIAGNOSIS — R1030 Lower abdominal pain, unspecified: Secondary | ICD-10-CM | POA: Insufficient documentation

## 2016-01-14 DIAGNOSIS — O26899 Other specified pregnancy related conditions, unspecified trimester: Secondary | ICD-10-CM | POA: Insufficient documentation

## 2016-01-14 LAB — COMPREHENSIVE METABOLIC PANEL
ALBUMIN: 3.5 g/dL (ref 3.5–5.0)
ALT: 10 U/L — ABNORMAL LOW (ref 14–54)
AST: 15 U/L (ref 15–41)
Alkaline Phosphatase: 31 U/L — ABNORMAL LOW (ref 38–126)
Anion gap: 4 — ABNORMAL LOW (ref 5–15)
BILIRUBIN TOTAL: 0.4 mg/dL (ref 0.3–1.2)
BUN: 10 mg/dL (ref 6–20)
CO2: 22 mmol/L (ref 22–32)
Calcium: 8.7 mg/dL — ABNORMAL LOW (ref 8.9–10.3)
Chloride: 112 mmol/L — ABNORMAL HIGH (ref 101–111)
Creatinine, Ser: 0.95 mg/dL (ref 0.44–1.00)
GFR calc Af Amer: >60 mL/min (ref >60)
GFR calc non Af Amer: >60 mL/min (ref >60)
GLUCOSE: 87 mg/dL (ref 65–99)
POTASSIUM: 3.9 mmol/L (ref 3.5–5.1)
Sodium: 138 mmol/L (ref 135–145)
Total Protein: 6 g/dL — ABNORMAL LOW (ref 6.5–8.1)

## 2016-01-14 LAB — URINALYSIS, ROUTINE W REFLEX MICROSCOPIC
BILIRUBIN URINE: NEGATIVE
Glucose, UA: NEGATIVE mg/dL
HGB URINE DIPSTICK: NEGATIVE
Ketones, ur: NEGATIVE mg/dL
Leukocytes, UA: NEGATIVE
Nitrite: NEGATIVE
PROTEIN: NEGATIVE mg/dL
Specific Gravity, Urine: 1.03 (ref 1.005–1.030)
pH: 6 (ref 5.0–8.0)

## 2016-01-14 LAB — CBC
HCT: 33.5 % — ABNORMAL LOW (ref 36.0–46.0)
Hemoglobin: 10.9 g/dL — ABNORMAL LOW (ref 12.0–15.0)
MCH: 28.5 pg (ref 26.0–34.0)
MCHC: 32.5 g/dL (ref 30.0–36.0)
MCV: 87.7 fL (ref 78.0–100.0)
PLATELETS: 285 10*3/uL (ref 150–400)
RBC: 3.82 MIL/uL — AB (ref 3.87–5.11)
RDW: 15.5 % (ref 11.5–15.5)
WBC: 7.3 10*3/uL (ref 4.0–10.5)

## 2016-01-14 LAB — POC URINE PREG, ED: PREG TEST UR: POSITIVE — AB

## 2016-01-14 LAB — LIPASE, BLOOD: Lipase: 29 U/L (ref 11–51)

## 2016-01-14 NOTE — ED Notes (Signed)
Pt not answering when called.

## 2016-01-14 NOTE — ED Triage Notes (Signed)
Pt. reports intermittent low abdominal pain for 1 week , denies nausea or vomiting , no fever or diarrhea.

## 2016-01-14 NOTE — ED Notes (Signed)
Called for patient 3x with no answer.  

## 2016-01-26 IMAGING — US US BREAST LTD UNI LEFT INC AXILLA
1 series · 5 of 5 positions shown · non-contrast
Comparison: None

CLINICAL DATA: Palpable mass in the left breast. Patient has noted
this for at least 2 years. Patient believes that it may have mildly
increased in size.

EXAM:
ULTRASOUND OF THE LEFT BREAST

[Series 1: us breast ltd uni left inc axilla · 0.05mm/px · 5 of 5 slices shown]
[im 1/5]
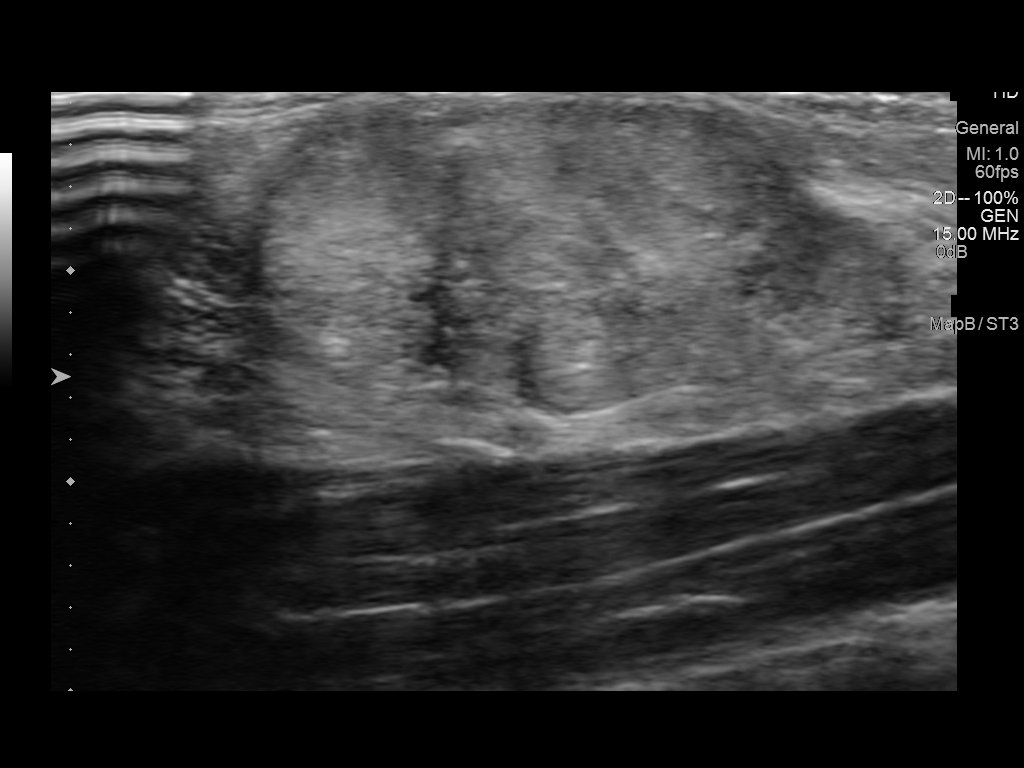
[im 2/5]
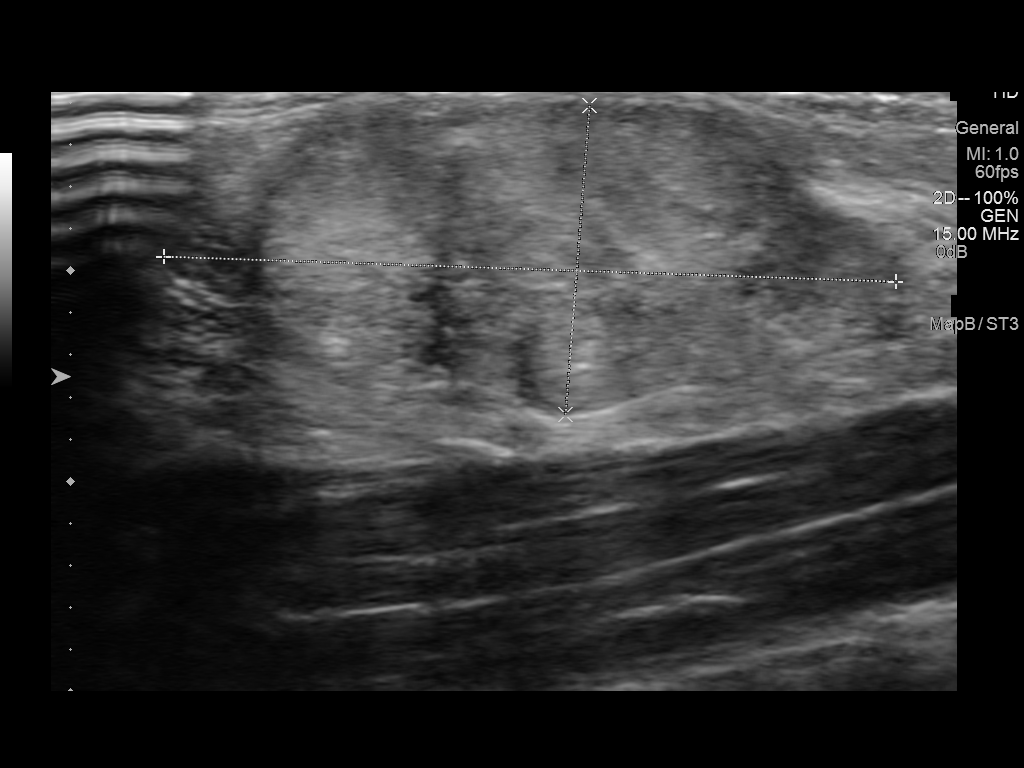
[im 3/5]
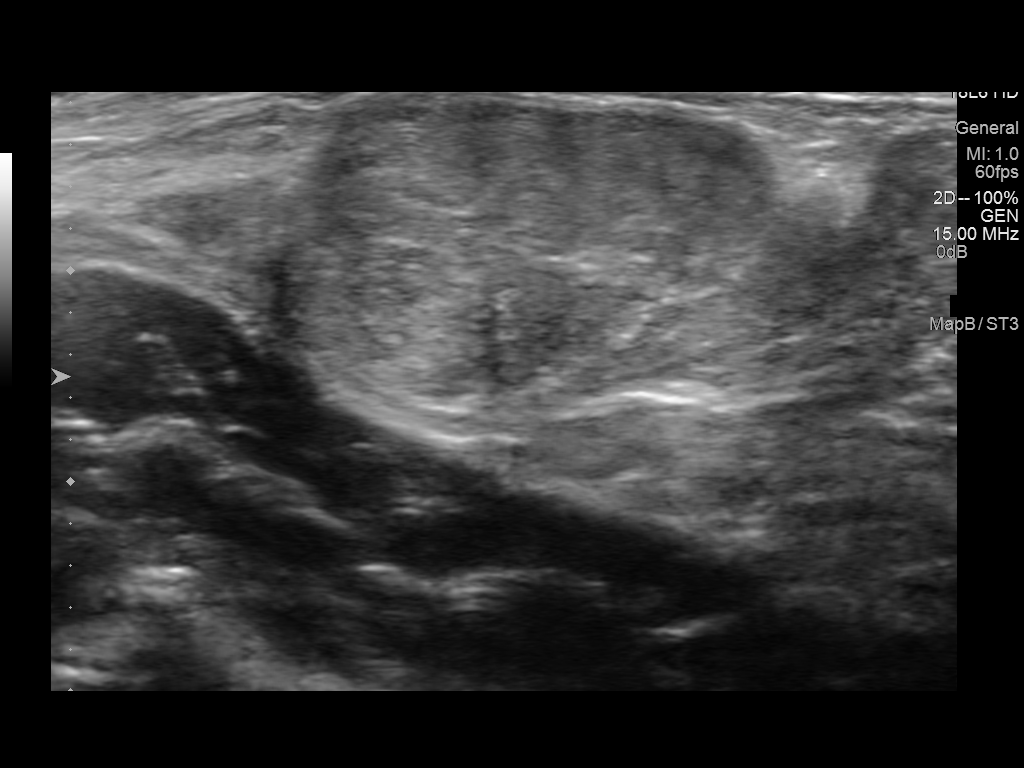
[im 4/5]
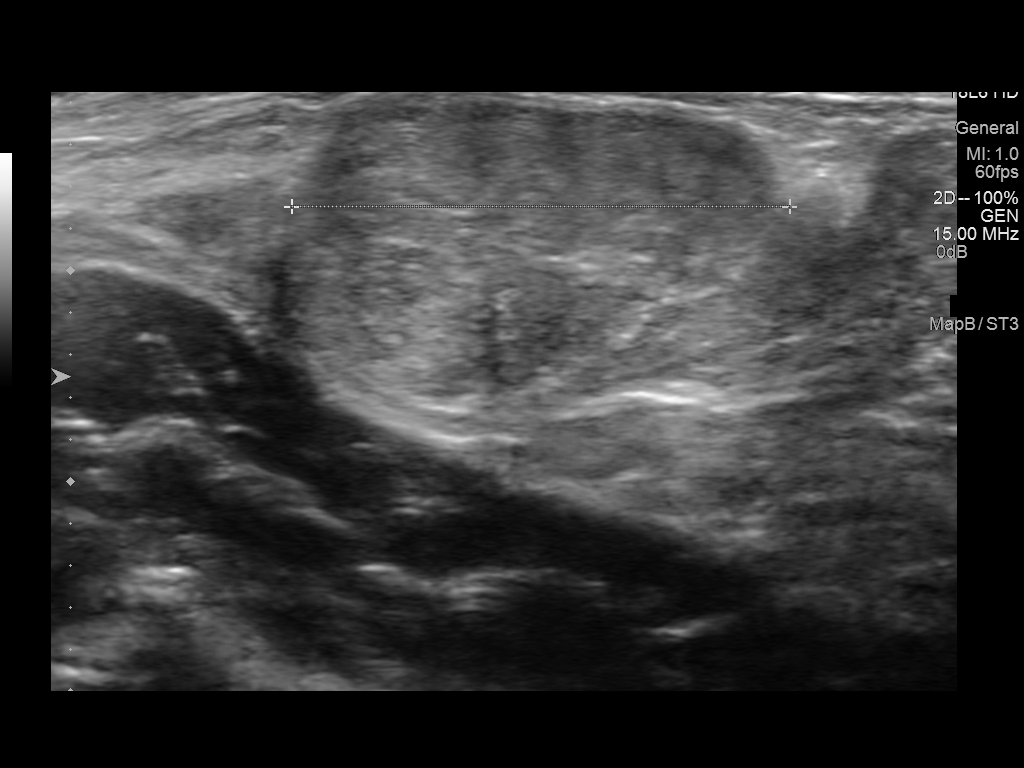
[im 5/5]
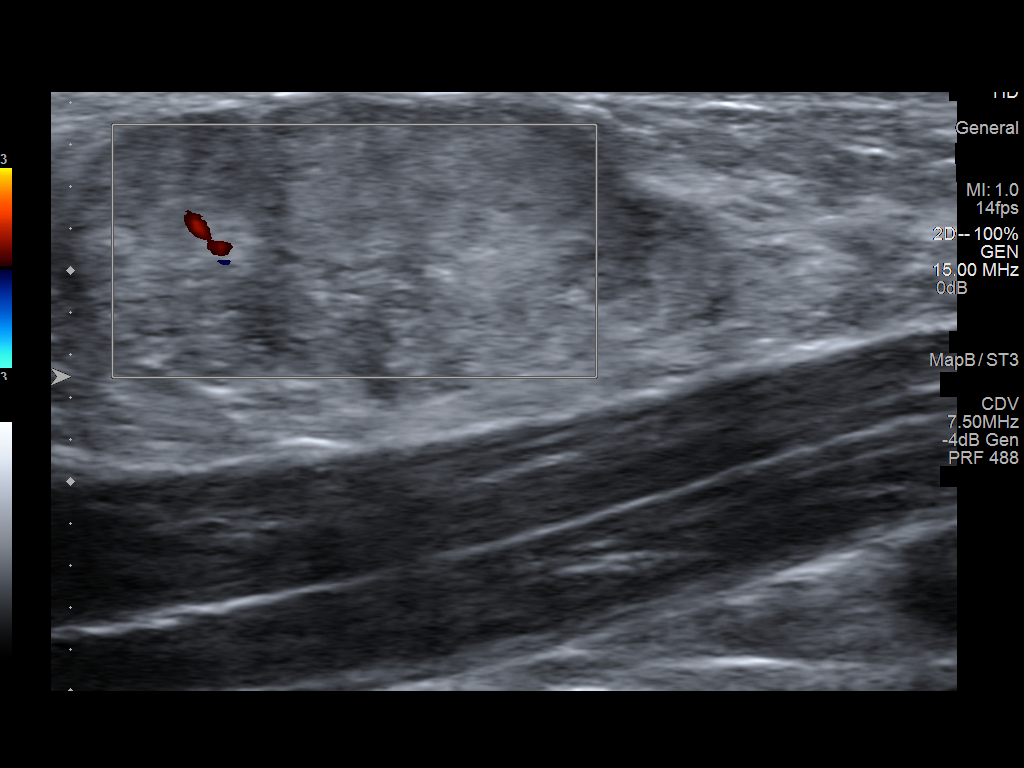

[5 of 5 positions shown; findings below may reference images not displayed]

FINDINGS: On physical exam, there is a smooth, approximately 3 cm, mobile mass
in the upper inner left breast.

Ultrasound is performed, showing an oval mildly heterogeneous
intermediate echogenicity mass in the 10 o'clock position of the
left breast, 8 cm from the nipple, measuring 3.5 cm by 1.5 cm by
cm. This corresponds to the palpable abnormality.
IMPRESSION: Probably benign left breast mass. Mass is almost certainly a
fibroadenoma. Short-term follow-up is recommended.

RECOMMENDATION:
Six month followup ultrasound a left breast. Patient did express a
desired to have the mass surgically removed. If the mass is
surgically excised, No short-term follow-up would be indicated.

I have discussed the findings and recommendations with the patient.
Results were also provided in writing at the conclusion of the
visit. If applicable, a reminder letter will be sent to the patient
regarding the next appointment.

BI-RADS CATEGORY  3: Probably benign finding(s) - short interval
follow-up suggested.

## 2016-06-20 IMAGING — DX DG CHEST 2V
2 series · 2 of 2 positions shown · non-contrast
Comparison: None.

CLINICAL DATA: Worsening pain behind the left breast, worse with
inspiration, status post left-sided lumpectomy. Initial encounter.

EXAM:
CHEST  2 VIEW

[chest pa]
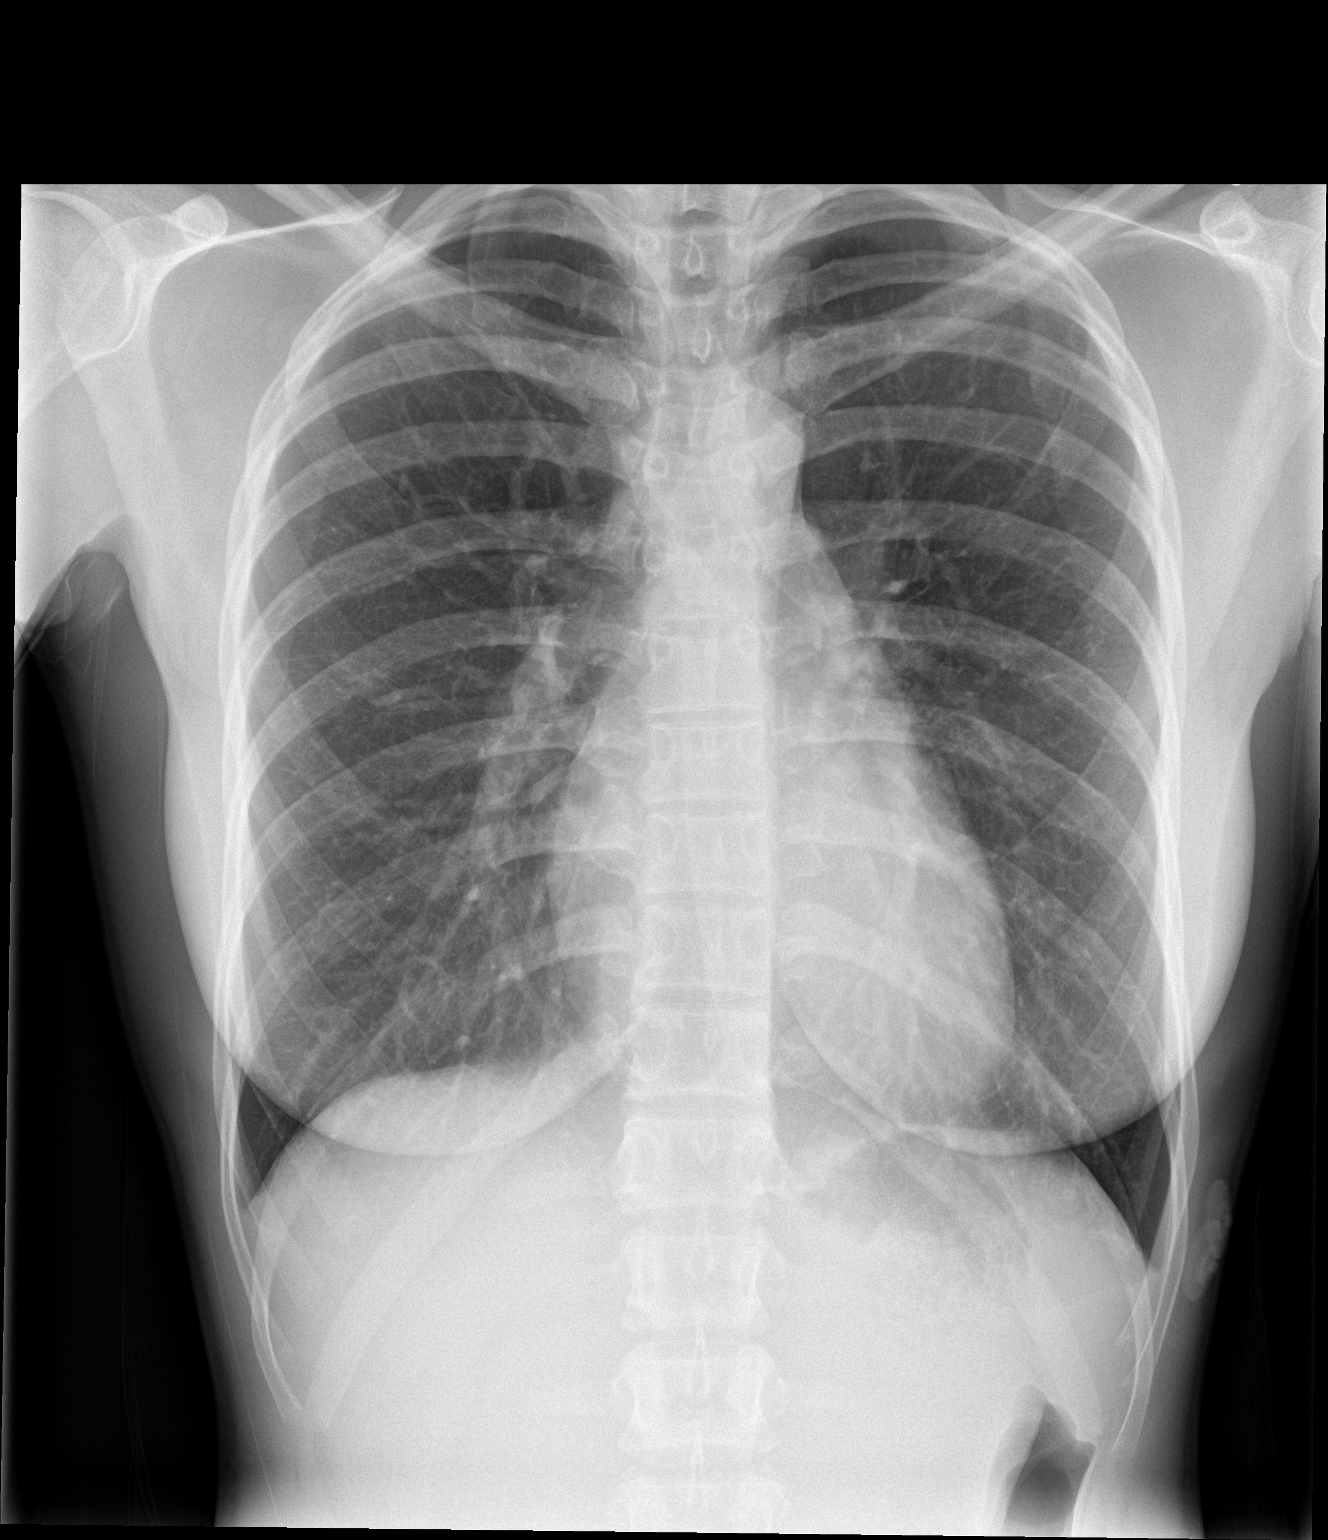

[chest lat]
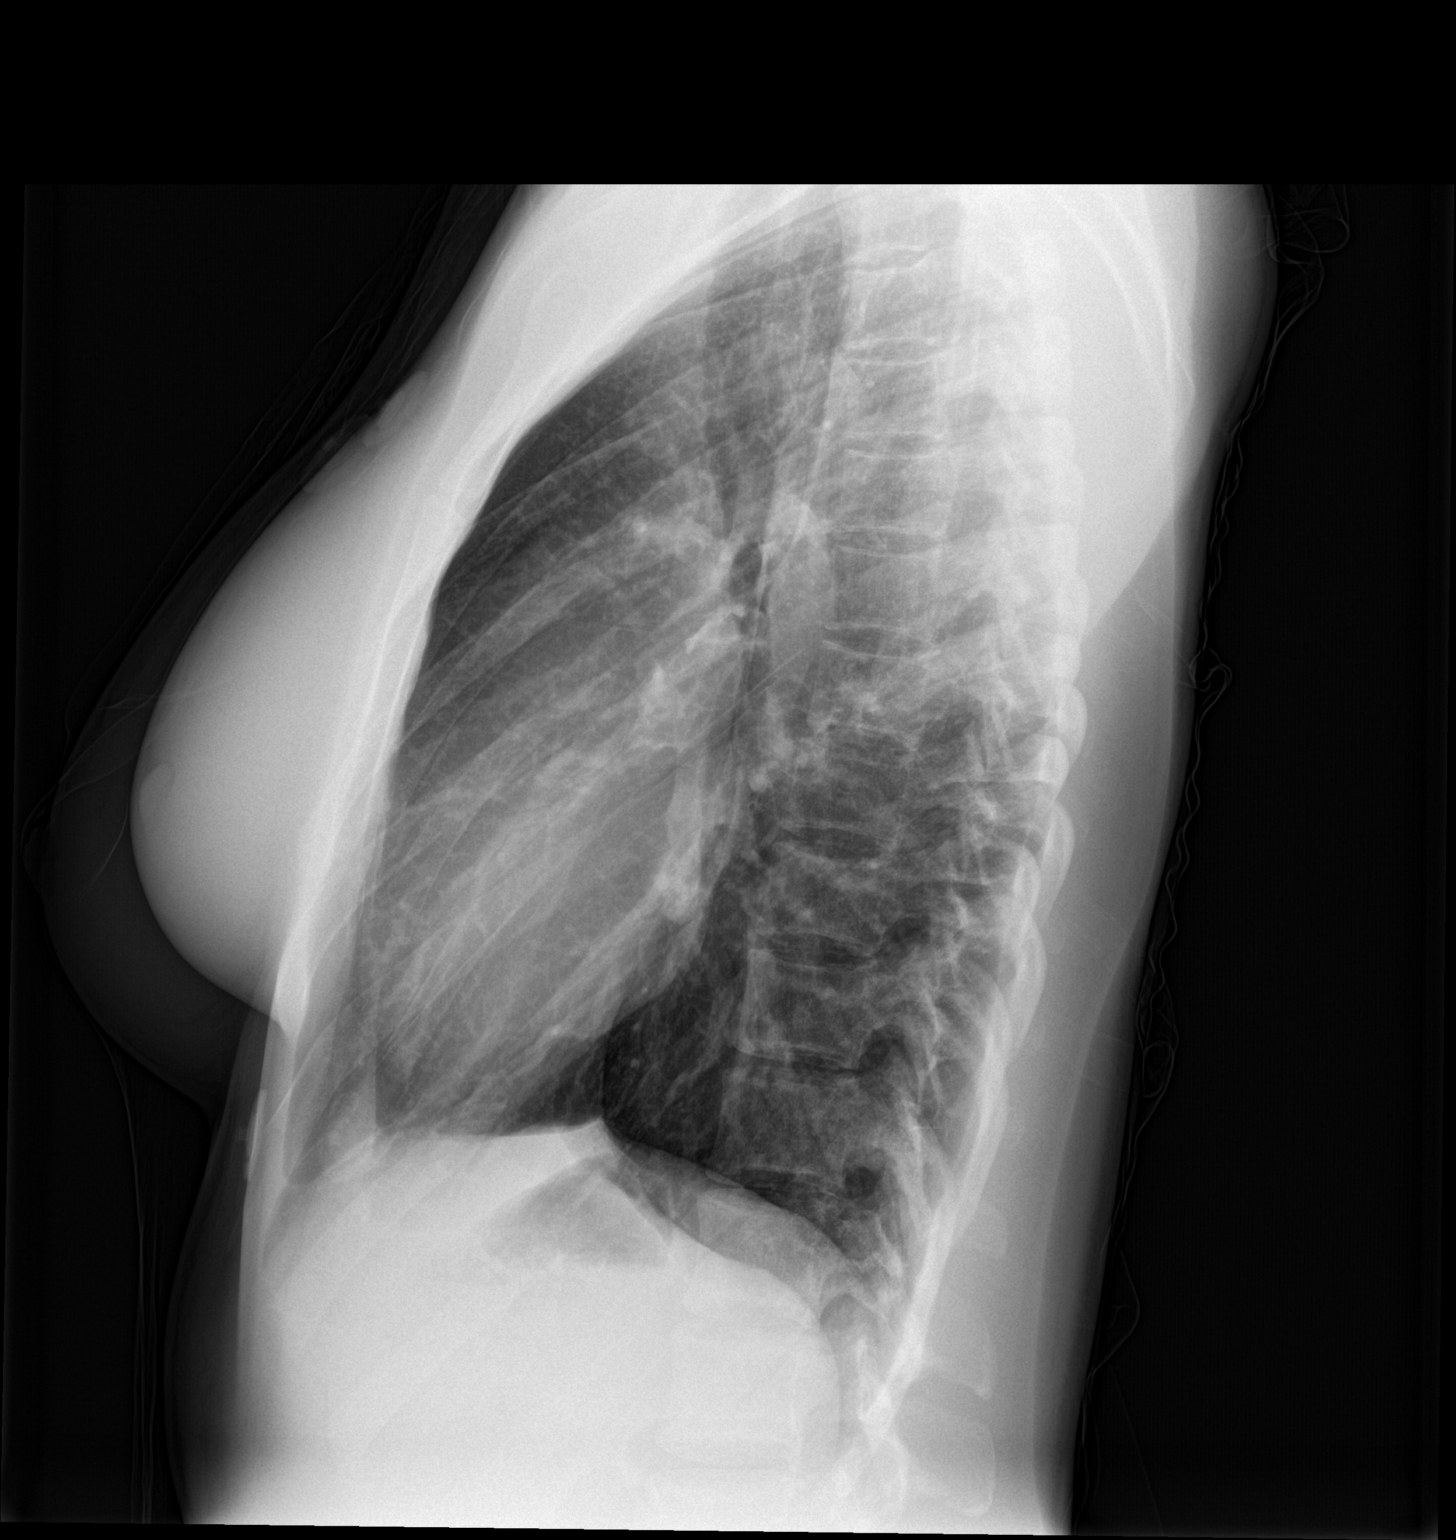

[2 of 2 positions shown; findings below may reference images not displayed]

FINDINGS: The lungs are well-aerated and clear. There is no evidence of focal
opacification, pleural effusion or pneumothorax.

The heart is normal in size; the mediastinal contour is within
normal limits. No acute osseous abnormalities are seen.
IMPRESSION: No acute cardiopulmonary process seen.

## 2016-07-05 ENCOUNTER — Inpatient Hospital Stay (HOSPITAL_COMMUNITY)
Admission: AD | Admit: 2016-07-05 | Discharge: 2016-07-05 | Discharge status: Against Medical Advice | Payer: No Typology Code available for payment source | Source: Ambulatory Visit | Attending: Obstetrics and Gynecology | Admitting: Obstetrics and Gynecology

## 2016-07-05 ENCOUNTER — Inpatient Hospital Stay (HOSPITAL_COMMUNITY): Payer: No Typology Code available for payment source

## 2016-07-05 ENCOUNTER — Encounter (HOSPITAL_COMMUNITY): Payer: Self-pay | Admitting: *Deleted

## 2016-07-05 DIAGNOSIS — Z79899 Other long term (current) drug therapy: Secondary | ICD-10-CM | POA: Diagnosis not present

## 2016-07-05 DIAGNOSIS — N939 Abnormal uterine and vaginal bleeding, unspecified: Secondary | ICD-10-CM | POA: Diagnosis present

## 2016-07-05 DIAGNOSIS — R102 Pelvic and perineal pain: Secondary | ICD-10-CM

## 2016-07-05 DIAGNOSIS — O046 Delayed or excessive hemorrhage following (induced) termination of pregnancy: Secondary | ICD-10-CM

## 2016-07-05 LAB — CBC WITH DIFFERENTIAL/PLATELET
BASOS ABS: 0 10*3/uL (ref 0.0–0.1)
BASOS PCT: 0 %
Eosinophils Absolute: 0.2 10*3/uL (ref 0.0–0.7)
Eosinophils Relative: 2 %
HEMATOCRIT: 36.4 % (ref 36.0–46.0)
HEMOGLOBIN: 12.6 g/dL (ref 12.0–15.0)
Lymphocytes Relative: 41 %
Lymphs Abs: 2.8 10*3/uL (ref 0.7–4.0)
MCH: 30.2 pg (ref 26.0–34.0)
MCHC: 34.6 g/dL (ref 30.0–36.0)
MCV: 87.3 fL (ref 78.0–100.0)
Monocytes Absolute: 0.2 10*3/uL (ref 0.1–1.0)
Monocytes Relative: 3 %
NEUTROS PCT: 54 %
Neutro Abs: 3.7 10*3/uL (ref 1.7–7.7)
Platelets: 308 10*3/uL (ref 150–400)
RBC: 4.17 MIL/uL (ref 3.87–5.11)
RDW: 14.4 % (ref 11.5–15.5)
WBC: 6.9 10*3/uL (ref 4.0–10.5)

## 2016-07-05 LAB — COMPREHENSIVE METABOLIC PANEL
ALBUMIN: 3.7 g/dL (ref 3.5–5.0)
ALK PHOS: 28 U/L — AB (ref 38–126)
ALT: 13 U/L — ABNORMAL LOW (ref 14–54)
AST: 16 U/L (ref 15–41)
Anion gap: 7 (ref 5–15)
BUN: 13 mg/dL (ref 6–20)
CO2: 25 mmol/L (ref 22–32)
Calcium: 8.8 mg/dL — ABNORMAL LOW (ref 8.9–10.3)
Chloride: 105 mmol/L (ref 101–111)
Creatinine, Ser: 0.76 mg/dL (ref 0.44–1.00)
GFR calc Af Amer: >60 mL/min (ref >60)
GFR calc non Af Amer: >60 mL/min (ref >60)
GLUCOSE: 86 mg/dL (ref 65–99)
POTASSIUM: 3.9 mmol/L (ref 3.5–5.1)
Sodium: 137 mmol/L (ref 135–145)
Total Bilirubin: 0.3 mg/dL (ref 0.3–1.2)
Total Protein: 7 g/dL (ref 6.5–8.1)

## 2016-07-05 LAB — HCG, QUANTITATIVE, PREGNANCY: hCG, Beta Chain, Quant, S: 3885 m[IU]/mL — ABNORMAL HIGH (ref <5)

## 2016-07-05 NOTE — MAU Provider Note (Signed)
History     CSN: [id number]  Arrival date and time: 07/05/16 1753   First Provider Initiated Contact with Patient 07/05/16 2057      Chief Complaint  Patient presents with  . Vaginal Bleeding  . Pelvic Pain   Emma Thompson is a 21 y.o. G1P0010 who presents today with back/pelvic pain and vaginal bleeding. She states that the pain started immediately after a termination on 06/30/16.    Vaginal Bleeding  The patient's primary symptoms include pelvic pain. This is a new problem. Episode onset: 6 days ago.  The problem occurs constantly. The problem has been unchanged. Pain severity now: 8/10  The problem affects the left side. She is not pregnant. Associated symptoms include back pain. Pertinent negatives include no chills, fever, nausea or vomiting. The vaginal bleeding is typical of menses. She has been passing clots. She has not been passing tissue. She has tried NSAIDs for the symptoms. The treatment provided no relief. Contraceptive use: has precription for OCPs. She has not started taking them.  Her menstrual history has been regular (LMP: 05/05/16 ).    Past Medical History:  Diagnosis Date  . Medical history non-contributory     Past Surgical History:  Procedure Laterality Date  . BREAST SURGERY    . NO PAST SURGERIES      Family History  Problem Relation Age of Onset  . Hyperlipidemia Sister     Social History  Substance Use Topics  . Smoking status: Never Smoker  . Smokeless tobacco: Never Used  . Alcohol use No    Allergies: No Known Allergies  Prescriptions Prior to Admission  Medication Sig Dispense Refill Last Dose  . fluconazole (DIFLUCAN) 150 MG tablet Take 1 tablet (150 mg total) by mouth once. Repeat if needed 2 tablet 0   . ibuprofen (ADVIL,MOTRIN) 400 MG tablet Take 1 tablet (400 mg total) by mouth every 6 (six) hours as needed. 30 tablet 0 Taking  . meloxicam (MOBIC) 15 MG tablet Take 1 tablet (15 mg total) by mouth daily. 30 tablet 1     Review of  Systems  Constitutional: Negative for chills and fever.  Gastrointestinal: Negative for nausea and vomiting.  Genitourinary: Positive for pelvic pain and vaginal bleeding.       Pain when having a BM.   Musculoskeletal: Positive for back pain.   Physical Exam   Blood pressure 124/74, pulse 78, temperature 99.5 F (37.5 C), temperature source Oral, resp. rate 18, height 5\' 7"  (1.702 m), weight 133 lb 0.6 oz (60.3 kg).  Physical Exam  Nursing note and vitals reviewed. Constitutional: She is oriented to person, place, and time. She appears well-developed and well-nourished. No distress.  HENT:  Head: Normocephalic.  Cardiovascular: Normal rate.   Respiratory: Effort normal.  Musculoskeletal: Normal range of motion.  Neurological: She is alert and oriented to person, place, and time.  Skin: Skin is warm and dry.  Psychiatric: She has a normal mood and affect.   Results for orders placed or performed during the hospital encounter of 07/05/16 (from the past 24 hour(s))  CBC with Differential/Platelet     Status: None   Collection Time: 07/05/16  9:14 PM  Result Value Ref Range   WBC 6.9 4.0 - 10.5 K/uL   RBC 4.17 3.87 - 5.11 MIL/uL   Hemoglobin 12.6 12.0 - 15.0 g/dL   HCT 78.2 95.6 - 21.3 %   MCV 87.3 78.0 - 100.0 fL   MCH 30.2 26.0 - 34.0 pg  MCHC 34.6 30.0 - 36.0 g/dL   RDW 16.1 09.6 - 04.5 %   Platelets 308 150 - 400 K/uL   Neutrophils Relative % 54 %   Neutro Abs 3.7 1.7 - 7.7 K/uL   Lymphocytes Relative 41 %   Lymphs Abs 2.8 0.7 - 4.0 K/uL   Monocytes Relative 3 %   Monocytes Absolute 0.2 0.1 - 1.0 K/uL   Eosinophils Relative 2 %   Eosinophils Absolute 0.2 0.0 - 0.7 K/uL   Basophils Relative 0 %   Basophils Absolute 0.0 0.0 - 0.1 K/uL  Comprehensive metabolic panel     Status: Abnormal   Collection Time: 07/05/16  9:14 PM  Result Value Ref Range   Sodium 137 135 - 145 mmol/L   Potassium 3.9 3.5 - 5.1 mmol/L   Chloride 105 101 - 111 mmol/L   CO2 25 22 - 32 mmol/L    Glucose, Bld 86 65 - 99 mg/dL   BUN 13 6 - 20 mg/dL   Creatinine, Ser 4.09 0.44 - 1.00 mg/dL   Calcium 8.8 (L) 8.9 - 10.3 mg/dL   Total Protein 7.0 6.5 - 8.1 g/dL   Albumin 3.7 3.5 - 5.0 g/dL   AST 16 15 - 41 U/L   ALT 13 (L) 14 - 54 U/L   Alkaline Phosphatase 28 (L) 38 - 126 U/L   Total Bilirubin 0.3 0.3 - 1.2 mg/dL   GFR calc non Af Amer >60 >60 mL/min   GFR calc Af Amer >60 >60 mL/min   Anion gap 7 5 - 15  hCG, quantitative, pregnancy     Status: Abnormal   Collection Time: 07/05/16  9:15 PM  Result Value Ref Range   hCG, Beta Chain, Quant, S 3,885 (H) <5 mIU/mL   US Transvaginal Non-ob  Result Date: 07/05/2016 CLINICAL DATA:  Post termination (surgical abortion) pain and bleeding. EXAM: TRANSABDOMINAL AND TRANSVAGINAL ULTRASOUND OF PELVIS TECHNIQUE: Both transabdominal and transvaginal ultrasound examinations of the pelvis were performed. Transabdominal technique was performed for global imaging of the pelvis including uterus, ovaries, adnexal regions, and pelvic cul-de-sac. It was necessary to proceed with endovaginal exam following the transabdominal exam to visualize the endometrium. COMPARISON:  None FINDINGS: Uterus Measurements: 9.4 x 4.7 x 6.2 cm. No fibroids or other mass visualized. Endometrium Thickness: 5 mm.  No focal abnormality visualized. Right ovary Measurements: 2.8 x 1.8 x 2.7 cm. Normal appearance/no adnexal mass. Left ovary Measurements: 2.7 x 1.6 x 1.4 cm. Normal appearance/no adnexal mass. Other findings No abnormal free fluid. IMPRESSION: Normal pelvic ultrasound. Electronically Signed   By: Awilda Metro M.D.   On: 07/05/2016 22:26   US Pelvis Complete  Result Date: 07/05/2016 CLINICAL DATA:  Post termination (surgical abortion) pain and bleeding. EXAM: TRANSABDOMINAL AND TRANSVAGINAL ULTRASOUND OF PELVIS TECHNIQUE: Both transabdominal and transvaginal ultrasound examinations of the pelvis were performed. Transabdominal technique was performed for global  imaging of the pelvis including uterus, ovaries, adnexal regions, and pelvic cul-de-sac. It was necessary to proceed with endovaginal exam following the transabdominal exam to visualize the endometrium. COMPARISON:  None FINDINGS: Uterus Measurements: 9.4 x 4.7 x 6.2 cm. No fibroids or other mass visualized. Endometrium Thickness: 5 mm.  No focal abnormality visualized. Right ovary Measurements: 2.8 x 1.8 x 2.7 cm. Normal appearance/no adnexal mass. Left ovary Measurements: 2.7 x 1.6 x 1.4 cm. Normal appearance/no adnexal mass. Other findings No abnormal free fluid. IMPRESSION: Normal pelvic ultrasound. Electronically Signed   By: Awilda Metro M.D.   On:  07/05/2016 22:26   MAU Course  Procedures  MDM Patient left AMA  Assessment and Plan  AMA   Tawnya CrookHogan, Aissata Wilmore Donovan 07/05/2016, 9:00 PM

## 2016-07-05 NOTE — MAU Note (Signed)
Pt had surgical [place]( 8.5 week pregnancy[place] Started bleeding and passing clots today and having back pain and cramping.

## 2016-07-06 ENCOUNTER — Encounter: Payer: Self-pay | Admitting: Obstetrics and Gynecology

## 2016-07-07 ENCOUNTER — Telehealth: Payer: Self-pay | Admitting: Advanced Practice Midwife

## 2016-07-07 NOTE — Telephone Encounter (Signed)
Seen two days ago for post abortion pain.  US done but patient left AMA without finding out results  Now wants to know results, told her they were negative  Now wants to know why she had to have a pelvic, reasons reviewed.  Wants to know why her back still hurts. Reviewed may be related to cramping, position on OR table, or nonGYN reason.  Wants toknow why her Bms are painful. Discussed I cannot tell her since I have not seen her. Could be hemorrhoid, fissure or other non GYN cause  Recommend followup with Family Doctor.

## 2018-03-01 IMAGING — US US PELVIS COMPLETE
1 series · 15 of 25 positions shown · non-contrast
Comparison: None

CLINICAL DATA: Post termination (surgical abortion) pain and
bleeding.



[Series 1: us pelvis complete · 15 of 64 slices shown]
[im 1/64]
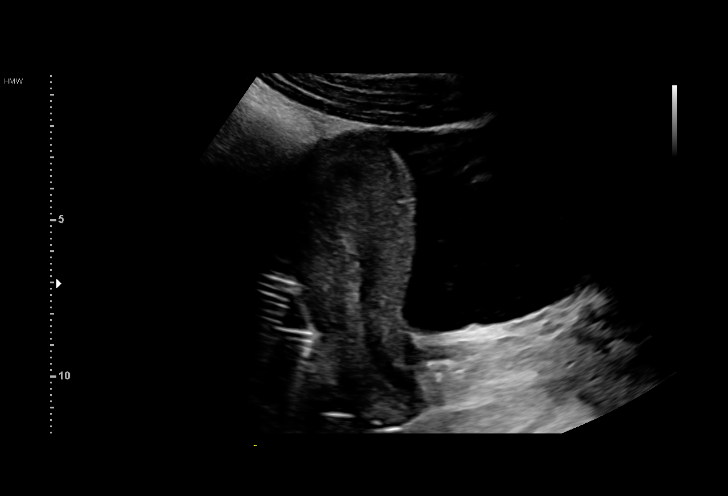
[im 6/64]
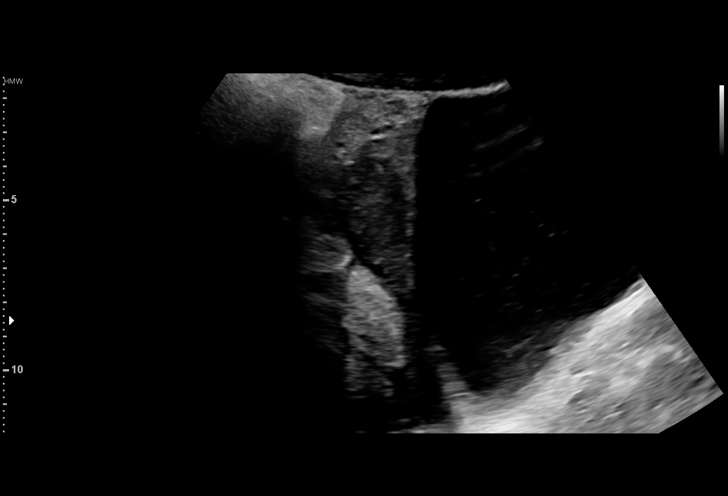
[im 11/64]
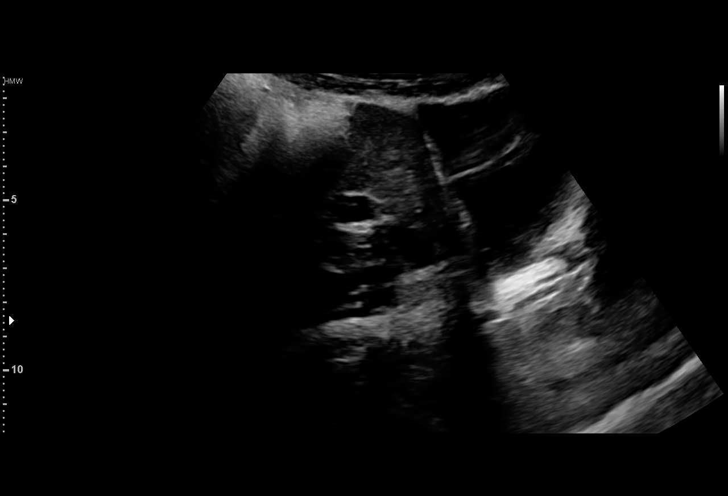
[im 14/64]
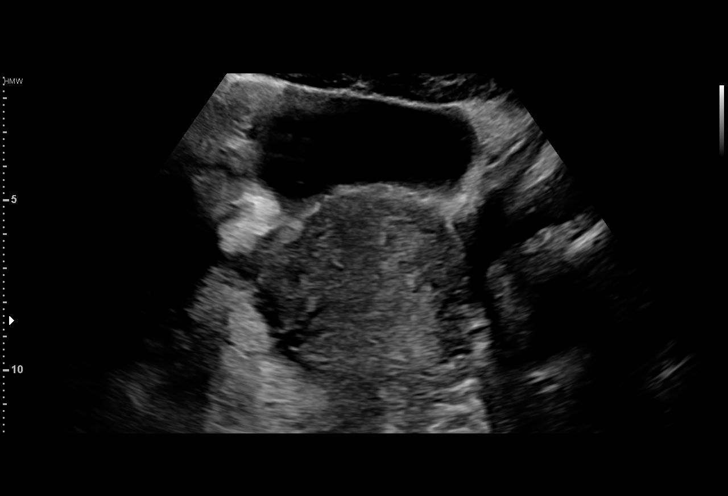
[im 19/64]
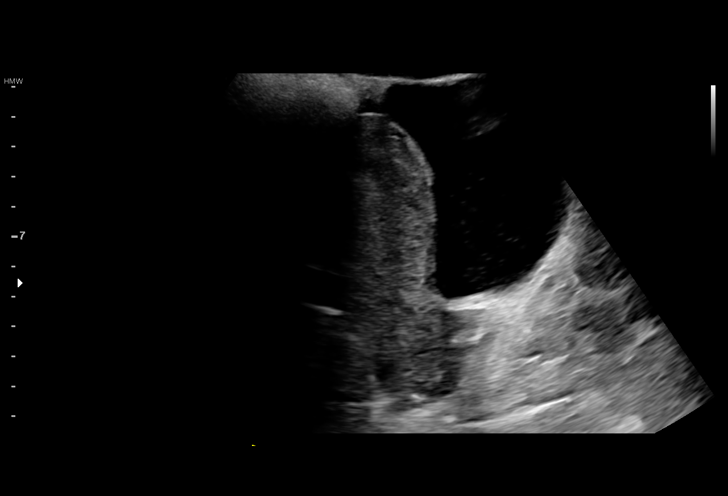
[im 24/64]
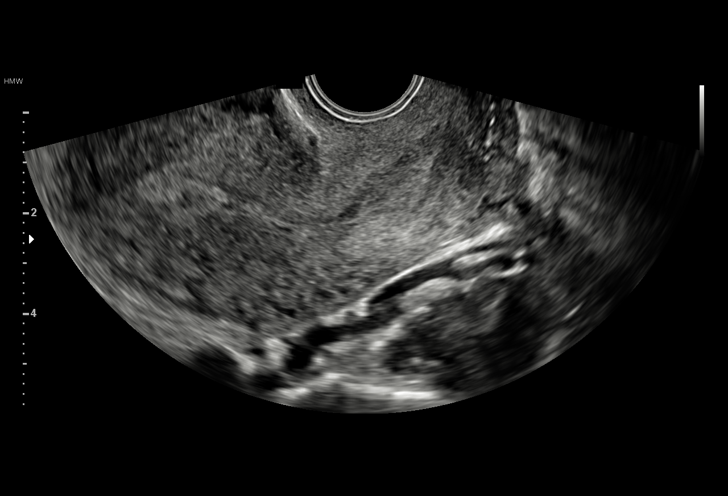
[im 27/64]
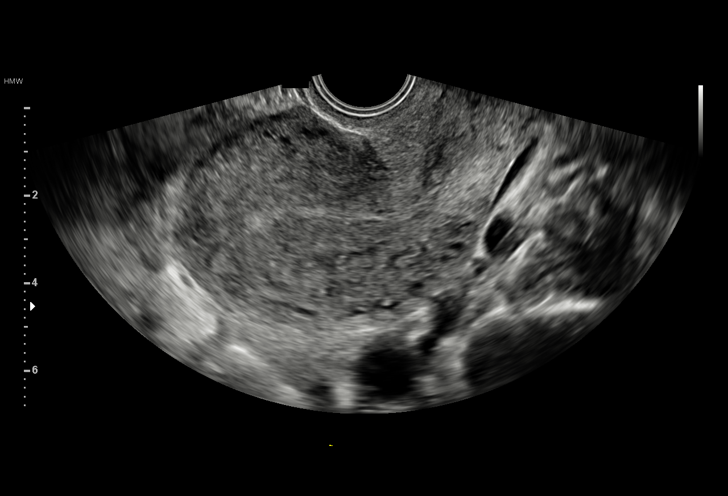
[im 32/64]
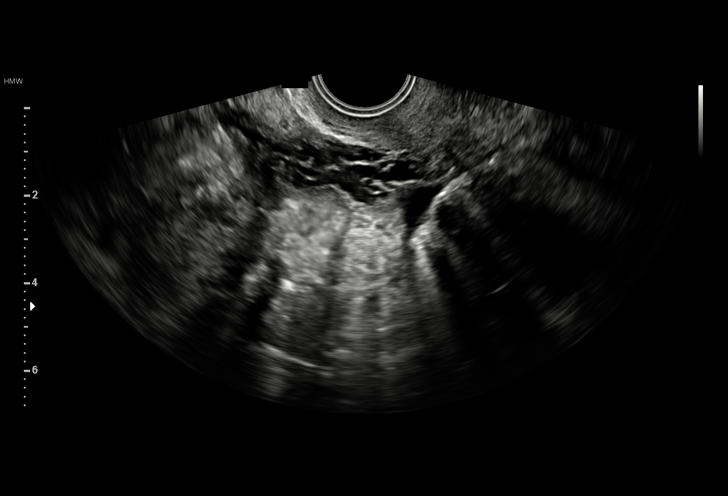
[im 37/64]
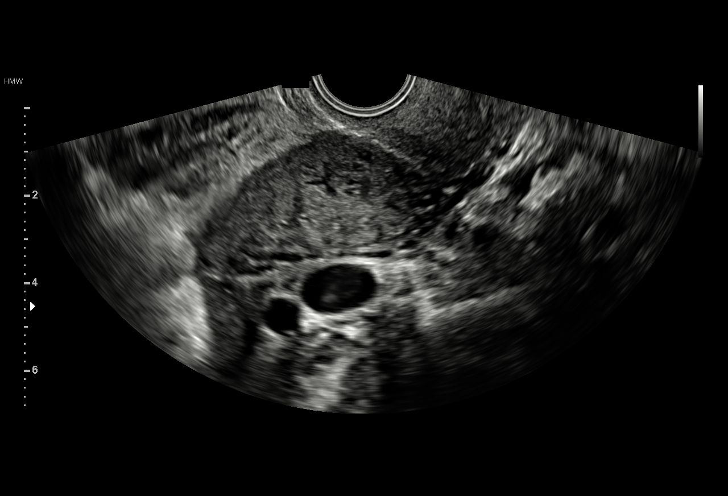
[im 40/64]
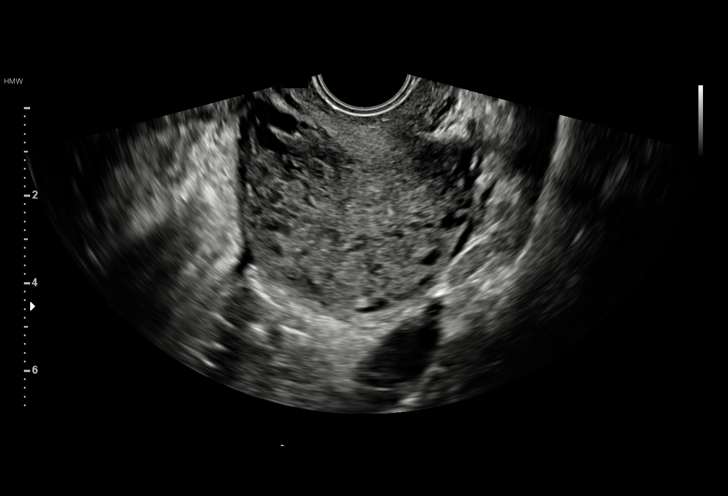
[im 45/64]
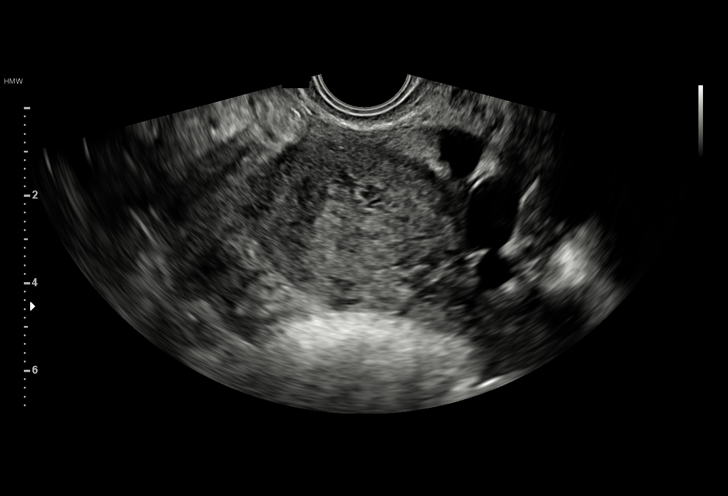
[im 50/64]
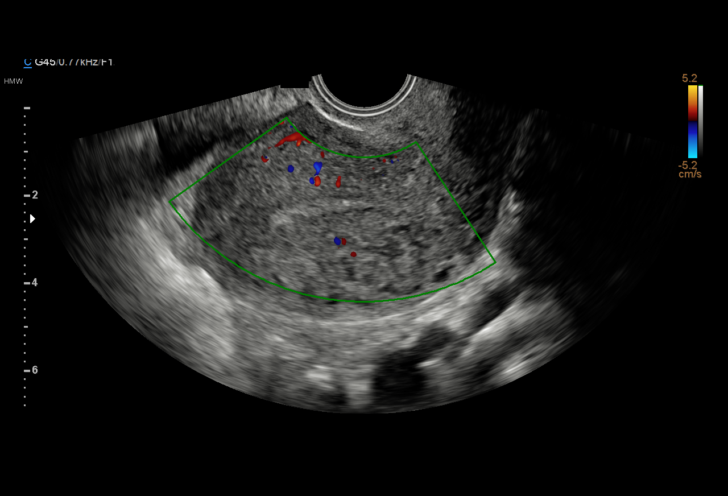
[im 53/64]
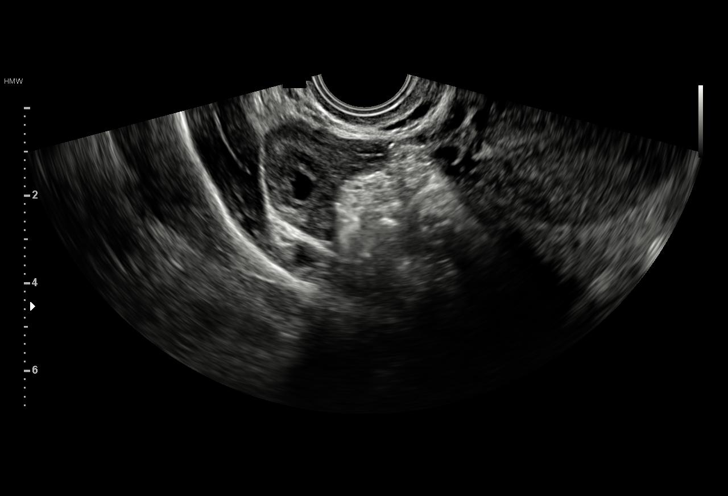
[im 58/64]
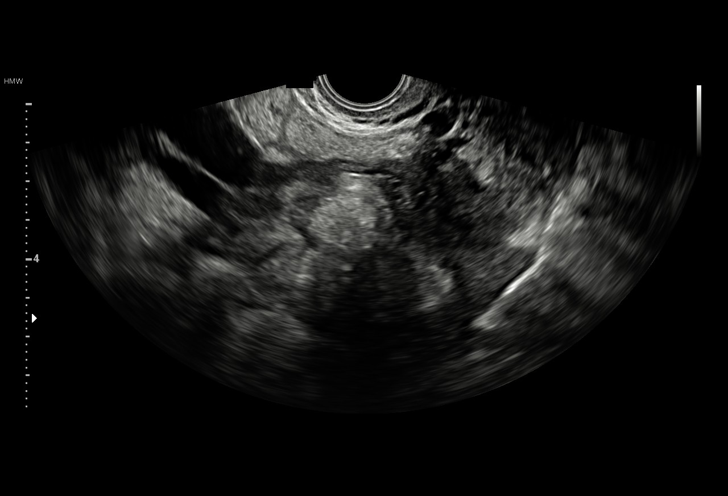
[im 64/64]
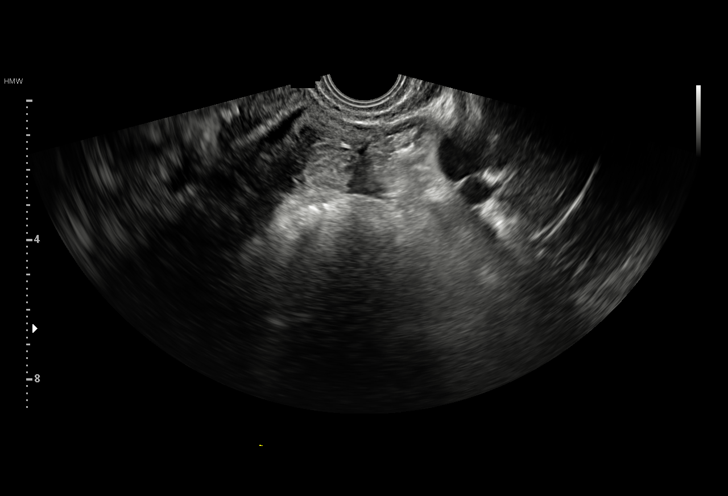

[15 of 25 positions shown; findings below may reference images not displayed]

FINDINGS: Uterus

Measurements: 9.4 x 4.7 x 6.2 cm. No fibroids or other mass
visualized.

Endometrium

Thickness: 5 mm.  No focal abnormality visualized.

Right ovary

Measurements: 2.8 x 1.8 x 2.7 cm. Normal appearance/no adnexal mass.

Left ovary

Measurements: 2.7 x 1.6 x 1.4 cm. Normal appearance/no adnexal mass.

Other findings

No abnormal free fluid.
IMPRESSION: Normal pelvic ultrasound.

## 2022-06-08 ENCOUNTER — Inpatient Hospital Stay (HOSPITAL_COMMUNITY)
Admission: AD | Admit: 2022-06-08 | Discharge: 2022-06-08 | Disposition: A | Discharge status: Home / Self Care | Payer: BLUE CROSS/BLUE SHIELD | Attending: Obstetrics and Gynecology | Admitting: Obstetrics and Gynecology

## 2022-06-08 ENCOUNTER — Encounter (HOSPITAL_COMMUNITY): Payer: Self-pay | Admitting: Obstetrics and Gynecology

## 2022-06-08 DIAGNOSIS — N912 Amenorrhea, unspecified: Secondary | ICD-10-CM | POA: Insufficient documentation

## 2022-06-08 DIAGNOSIS — Z3200 Encounter for pregnancy test, result unknown: Secondary | ICD-10-CM

## 2022-06-08 NOTE — MAU Note (Signed)
.  Emma Thompson is a 27 y.o. at Unknown here in MAU reporting: took a home pregnancy test that was positive two days ago. She reports she is unsure of when her last period was, but that she has had two since having her IUD taken out on November 6th. Denies pain, vaginal discharge, or bleeding. LMP: pt is unsure Onset of complaint: today Pain score: Denies pain Vitals:   06/08/22 0805  BP: 112/60  Pulse: 99  Resp: 16  Temp: 97.8 F (36.6 C)  SpO2: 100%     FHT:N/A Lab orders placed from triage:  N/A

## 2022-06-08 NOTE — MAU Provider Note (Signed)
  S:   27 y.o. G2P1011 @Unknown  by LMP presents to MAU for pregnancy confirmation.  She denies abdominal pain or vaginal bleeding today.    O: BP 112/60 (BP Location: Right Arm)   Pulse 99   Temp 97.8 F (36.6 C) (Oral)   Resp 16   Ht 5\' 7"  (1.702 m)   Wt 70.9 kg   SpO2 100%   BMI 24.50 kg/m  Physical Examination: General appearance - alert, well appearing, and in no distress, oriented to person, place, and time and acyanotic, in no respiratory distress  No results found for this or any previous visit (from the past 48 hour(s)).  A: Missed menses   P: D/C home Informed pt we do not do pregnancy verifications in MAU Referred to [facility] for pregnancy test & verification Return to MAU as needed for pregnancy related emergencies  Noni Saupe, NP 8:23 AM

## 2022-06-13 ENCOUNTER — Inpatient Hospital Stay (HOSPITAL_COMMUNITY)
Admission: AD | Admit: 2022-06-13 | Discharge: 2022-06-13 | Discharge status: Against Medical Advice | Payer: Medicaid Other | Attending: Obstetrics and Gynecology | Admitting: Obstetrics and Gynecology

## 2022-06-13 ENCOUNTER — Other Ambulatory Visit: Payer: Self-pay

## 2022-06-13 ENCOUNTER — Encounter (HOSPITAL_COMMUNITY): Payer: Self-pay | Admitting: Obstetrics and Gynecology

## 2022-06-13 DIAGNOSIS — Z5329 Procedure and treatment not carried out because of patient's decision for other reasons: Secondary | ICD-10-CM | POA: Insufficient documentation

## 2022-06-13 NOTE — MAU Note (Signed)
Pt states she cannot wait anymore. AMA form signed.

## 2022-06-13 NOTE — MAU Note (Signed)
Emma Thompson is a 27 y.o. here in MAU reporting: states she needs an IV because she is drained. Cannot eat because she is spitting up. 4 episodes of emesis in the past 24 hours.   LMP: unknown  Onset of complaint: ongoing  Pain score: 7/10  Vitals:   06/13/22 1313  BP: (!) 106/59  Pulse: 78  Resp: 16  Temp: 99.1 F (37.3 C)  SpO2: 98%     FHT:NA  Lab orders placed from triage: UA

## 2022-10-28 ENCOUNTER — Inpatient Hospital Stay (HOSPITAL_COMMUNITY)
Admission: AD | Admit: 2022-10-28 | Discharge: 2022-10-29 | Disposition: A | Discharge status: Home / Self Care | Payer: BC Managed Care – PPO | Source: Ambulatory Visit | Attending: Family Medicine | Admitting: Family Medicine

## 2022-10-28 ENCOUNTER — Encounter (HOSPITAL_COMMUNITY): Payer: Self-pay | Admitting: Family Medicine

## 2022-10-28 DIAGNOSIS — Z3A26 26 weeks gestation of pregnancy: Secondary | ICD-10-CM | POA: Insufficient documentation

## 2022-10-28 DIAGNOSIS — O368120 Decreased fetal movements, second trimester, not applicable or unspecified: Secondary | ICD-10-CM | POA: Diagnosis not present

## 2022-10-28 NOTE — MAU Provider Note (Signed)
  History     CSN: [id number]  Arrival date and time: 10/28/22 2158   None     Chief Complaint  Patient presents with   Decreased Fetal Movement   Emma Thompson is a 28 y.o. G4P1011 at [redacted]w[redacted]d who presents for DFM from 4PM to 10 PM. During this time she was only able to feel movement twice. She denies VB, LOF. She recently moved from [place] and plans to transfer her care and hopeful for a waterbirth. She is otherwise well.   {GYN/OB [id number]  Past Medical History:  Diagnosis Date   Medical history non-contributory     Past Surgical History:  Procedure Laterality Date   BREAST SURGERY      Family History  Problem Relation Age of Onset   Hyperlipidemia Sister     Social History   Tobacco Use   Smoking status: Never   Smokeless tobacco: Never  Substance Use Topics   Alcohol use: No    Alcohol/week: 0.0 standard drinks of alcohol   Drug use: No    Allergies: No Known Allergies  Medications Prior to Admission  Medication Sig Dispense Refill Last Dose   fluconazole (DIFLUCAN) 150 MG tablet Take 1 tablet (150 mg total) by mouth once. Repeat if needed 2 tablet 0    ibuprofen (ADVIL,MOTRIN) 400 MG tablet Take 1 tablet (400 mg total) by mouth every 6 (six) hours as needed. 30 tablet 0    meloxicam (MOBIC) 15 MG tablet Take 1 tablet (15 mg total) by mouth daily. 30 tablet 1     Review of Systems Physical Exam   Blood pressure 109/70, pulse 97, temperature 97.9 F (36.6 C), temperature source Oral, resp. rate 16, height 5\' 7"  (1.702 m), weight 76.5 kg, unknown if currently breastfeeding.  Physical Exam  MAU Course  Procedures  MDM ***  Assessment and Plan  ***  Emma Thompson 10/28/2022, 10:35 PM

## 2022-10-28 NOTE — MAU Note (Signed)
.  Emma Thompson is a 27 y.o. at Unknown here in MAU reporting: DFM with last movement noted tonight at 2030. Reports normal  movement earlier today. Denies SROM, vaginal bleeding or bloody show. Denies complications or problems with pregnancy. Denies uterine cramping or contractions. Uterus is soft and non tender Fetal movement palpated and audible in triage  Onset of complaint: 2030 Pain score: 0 Vitals:   10/28/22 2216  BP: 109/70  Pulse: 97  Resp: 16  Temp: 97.9 F (36.6 C)     FHT:139-145bpm Lab orders placed from triage:

## 2022-10-29 DIAGNOSIS — Z3A26 26 weeks gestation of pregnancy: Secondary | ICD-10-CM | POA: Diagnosis not present

## 2022-10-29 DIAGNOSIS — O368120 Decreased fetal movements, second trimester, not applicable or unspecified: Secondary | ICD-10-CM | POA: Diagnosis present

## 2023-02-01 ENCOUNTER — Encounter (HOSPITAL_COMMUNITY): Payer: Self-pay | Admitting: Obstetrics & Gynecology

## 2023-02-01 ENCOUNTER — Inpatient Hospital Stay (HOSPITAL_COMMUNITY)
Admission: AD | Admit: 2023-02-01 | Discharge: 2023-02-02 | Disposition: A | Discharge status: Home / Self Care | Payer: BC Managed Care – PPO | Attending: Obstetrics & Gynecology | Admitting: Obstetrics & Gynecology

## 2023-02-01 DIAGNOSIS — R0781 Pleurodynia: Secondary | ICD-10-CM | POA: Insufficient documentation

## 2023-02-01 DIAGNOSIS — R7989 Other specified abnormal findings of blood chemistry: Secondary | ICD-10-CM

## 2023-02-01 DIAGNOSIS — O9089 Other complications of the puerperium, not elsewhere classified: Secondary | ICD-10-CM | POA: Insufficient documentation

## 2023-02-01 DIAGNOSIS — R079 Chest pain, unspecified: Secondary | ICD-10-CM

## 2023-02-01 DIAGNOSIS — R0789 Other chest pain: Secondary | ICD-10-CM | POA: Insufficient documentation

## 2023-02-01 DIAGNOSIS — R7401 Elevation of levels of liver transaminase levels: Secondary | ICD-10-CM | POA: Insufficient documentation

## 2023-02-01 NOTE — MAU Note (Signed)
.  Emma Thompson is a 27 y.o. at [redacted]w[redacted]d here in MAU reporting: PP vag delivery on 01/30/23. Stated about 2 hour ago started having chest pain pressure. Pain get worse and is stabbing when she takes a deep breath in . Feel like she can't take a deep breath in. Pain in more on the right upper chest area LMP:  Onset of complaint: 2 hours  Pain score: 5-6 Vitals:   02/01/23 2340  BP: 110/65  Pulse: 77  Resp: 18  Temp: 98.2 F (36.8 C)      Lab orders placed from triage:

## 2023-02-02 DIAGNOSIS — R079 Chest pain, unspecified: Secondary | ICD-10-CM | POA: Diagnosis not present

## 2023-02-02 DIAGNOSIS — O9089 Other complications of the puerperium, not elsewhere classified: Secondary | ICD-10-CM | POA: Diagnosis not present

## 2023-02-02 DIAGNOSIS — R7401 Elevation of levels of liver transaminase levels: Secondary | ICD-10-CM | POA: Diagnosis not present

## 2023-02-02 DIAGNOSIS — R0781 Pleurodynia: Secondary | ICD-10-CM | POA: Diagnosis present

## 2023-02-02 DIAGNOSIS — R0789 Other chest pain: Secondary | ICD-10-CM | POA: Diagnosis present

## 2023-02-02 LAB — CBC
HCT: 34.8 % — ABNORMAL LOW (ref 36.0–46.0)
Hemoglobin: 11.3 g/dL — ABNORMAL LOW (ref 12.0–15.0)
MCH: 29.5 pg (ref 26.0–34.0)
MCHC: 32.5 g/dL (ref 30.0–36.0)
MCV: 90.9 fL (ref 80.0–100.0)
Platelets: 258 10*3/uL (ref 150–400)
RBC: 3.83 MIL/uL — ABNORMAL LOW (ref 3.87–5.11)
RDW: 13.8 % (ref 11.5–15.5)
WBC: 8.7 10*3/uL (ref 4.0–10.5)
nRBC: 0 % (ref 0.0–0.2)

## 2023-02-02 LAB — COMPREHENSIVE METABOLIC PANEL
ALT: 50 U/L — ABNORMAL HIGH (ref 0–44)
AST: 42 U/L — ABNORMAL HIGH (ref 15–41)
Albumin: 2.5 g/dL — ABNORMAL LOW (ref 3.5–5.0)
Alkaline Phosphatase: 58 U/L (ref 38–126)
Anion gap: 10 (ref 5–15)
BUN: 11 mg/dL (ref 6–20)
CO2: 24 mmol/L (ref 22–32)
Calcium: 9.3 mg/dL (ref 8.9–10.3)
Chloride: 103 mmol/L (ref 98–111)
Creatinine, Ser: 1 mg/dL (ref 0.44–1.00)
GFR, Estimated: >60 mL/min (ref >60)
Glucose, Bld: 85 mg/dL (ref 70–99)
Potassium: 3.9 mmol/L (ref 3.5–5.1)
Sodium: 137 mmol/L (ref 135–145)
Total Bilirubin: 0.3 mg/dL (ref 0.3–1.2)
Total Protein: 5.4 g/dL — ABNORMAL LOW (ref 6.5–8.1)

## 2023-02-02 LAB — TROPONIN I (HIGH SENSITIVITY): Troponin I (High Sensitivity): 5 ng/L (ref <18)

## 2023-02-02 LAB — BRAIN NATRIURETIC PEPTIDE: B Natriuretic Peptide: 131.3 pg/mL — ABNORMAL HIGH (ref 0.0–100.0)

## 2023-02-02 MED ORDER — CYCLOBENZAPRINE HCL 5 MG PO TABS
5.0000 mg | ORAL_TABLET | Freq: Once | ORAL | Status: AC
  Administered 2023-02-02: 5 mg via ORAL
  Filled 2023-02-02: qty 1

## 2023-02-02 MED ORDER — ACETAMINOPHEN 500 MG PO TABS
1000.0000 mg | ORAL_TABLET | Freq: Four times a day (QID) | ORAL | Status: AC | PRN
Start: 2023-02-02 — End: ?

## 2023-02-02 NOTE — MAU Provider Note (Signed)
[facility]N Attending MAU Note  Chief Complaint: Chest Pain   Event Date/Time   First Provider Initiated Contact with Patient 02/01/23 2353     SUBJECTIVE Emma Thompson is a 27 y.o. [id number] s/p uncomplicated vaginal delivery at [facility] in [place] on 01/30/23 and uncomplicated immediate postpartum course, here today with chest pain. This episode of chest pain started about 2 hours ago, and was more like pressure initially. Pain got worse and felt like stabbing pain when she took deep breaths. Pain is in the right upper chest area, no radiation to anywhere, just localized.  No associated shortness of breath, palpitations, heart racing. Reports having similar episodes during this pregnancy and after last pregnancy, had negative evaluation done for these episodes.  Had some bilateral leg swelling initially after delivery but this is much better now.  No cardiac or pulmonary disorder history, no HTN disorders this pregnancy or in her history. Feels this pain is different from her breast engorgement pain. Denies any abnormal vaginal discharge, fevers, chills, sweats, dysuria, nausea, vomiting, other GI or GU symptoms or other general symptoms.   Past Medical History:  Diagnosis Date   Medical history non-contributory    OB History  Gravida Para Term Preterm AB Living  4 2 2  0 2 2  SAB IAB Ectopic Multiple Live Births  0 2 0 0 2    # Outcome Date GA Lbr Len/2nd Weight Sex Type Anes PTL Lv  4 Term      Vag-Spont   LIV  3 IAB           2 IAB           1 Term      Vag-Spont   LIV   Past Surgical History:  Procedure Laterality Date   BREAST SURGERY     Social History   Socioeconomic History   Marital status: Single    Spouse name: Not on file   Number of children: Not on file   Years of education: Not on file   Highest education level: Not on file  Occupational History   Not on file  Tobacco Use   Smoking status: Never   Smokeless tobacco: Never  Vaping Use   Vaping  status: Never Used  Substance and Sexual Activity   Alcohol use: No    Alcohol/week: 0.0 standard drinks of alcohol   Drug use: No   Sexual activity: Yes  Other Topics Concern   Not on file  Social History Narrative   Not on file   Social Determinants of Health   Financial Resource Strain: Not on file  Food Insecurity: No Food Insecurity (01/08/2022)   Received from [facility], [facility]   Hunger Vital Sign    Worried About Running Out of Food in the Last Year: Never true    Ran Out of Food in the Last Year: Never true  Transportation Needs: No Transportation Needs (01/08/2022)   Received from [facility], [facility]   [facility] - Transportation    In the past 12 months, has lack of transportation kept you from medical appointments or from getting medications?: No    Lack of Transportation (Non-Medical): No  Physical Activity: Not on file  Stress: Not on file  Social Connections: Unknown (10/08/2021)   Received from [facility]   Social Network    Social Network: Not on file  Intimate Partner Violence: Unknown (08/30/2021)   Received from [facility]  HITS    Physically Hurt: Not on file    Insult or Talk Down To: Not on file    Threaten Physical Harm: Not on file    Scream or Curse: Not on file   No current facility-administered medications on file prior to encounter.   Current Outpatient Medications on File Prior to Encounter  Medication Sig Dispense Refill   ibuprofen (ADVIL) 800 MG tablet Take 800 mg by mouth every 8 (eight) hours as needed for moderate pain.     Prenatal Vit-Fe Fumarate-FA (PRENATAL MULTIVITAMIN) TABS tablet Take 1 tablet by mouth daily at 12 noon.     No Known Allergies  ROS: Pertinent items in HPI  OBJECTIVE BP 110/65   Pulse 77   Temp 98.2 F (36.8 C)   Resp 18   Ht 5\' 7"  (1.702 m)   Wt 88 kg   SpO2 100%   Breastfeeding Unknown   BMI 30.38 kg/m   CONSTITUTIONAL: Well-developed, well-nourished female in no acute distress.  NECK: Normal range of motion, supple, no masses.  Normal thyroid.  SKIN: Skin is warm and dry. No rash noted. Not diaphoretic. No erythema. No pallor. NEUROLGIC: Alert and oriented to person, place, and time. Normal reflexes, muscle tone coordination. No cranial nerve deficit noted. PSYCHIATRIC: Normal mood and affect. Normal behavior. Normal judgment and thought content. CARDIOVASCULAR: Normal heart rate noted, point tenderness noted in right upper chest area RESPIRATORY: Effort and breath sounds normal, no problems with respiration noted. ABDOMEN: Soft, normal bowel sounds, no distention noted.  No tenderness, rebound or guarding.  PELVIC:Deferred MUSCULOSKELETAL: Normal range of motion. No tenderness.  No  cyanosis, clubbing, or edema.  2+ distal pulses.  LAB RESULTS Results for orders placed or performed during the hospital encounter of 02/01/23 (from the past 48 hour(s))  CBC     Status: Abnormal   Collection Time: 02/02/23 12:04 AM  Result Value Ref Range   WBC 8.7 4.0 - 10.5 K/uL   RBC 3.83 (L) 3.87 - 5.11 MIL/uL   Hemoglobin 11.3 (L) 12.0 - 15.0 g/dL   HCT 11.9 (L) 14.7 - 82.9 %   MCV 90.9 80.0 - 100.0 fL   MCH 29.5 26.0 - 34.0 pg   MCHC 32.5 30.0 - 36.0 g/dL   RDW 56.2 13.0 - 86.5 %   Platelets 258 150 - 400 K/uL   nRBC 0.0 0.0 - 0.2 %    Comment: Performed at [facility], [address], [place], [place] [address]  Comprehensive metabolic panel     Status: Abnormal   Collection Time: 02/02/23 12:04 AM  Result Value Ref Range   Sodium 137 135 - 145 mmol/L   Potassium 3.9 3.5 - 5.1 mmol/L   Chloride 103 98 - 111 mmol/L   CO2 24 22 - 32 mmol/L   Glucose, Bld 85 70 - 99 mg/dL    Comment: Glucose reference range applies only to samples taken after fasting for at least 8 hours.   BUN 11 6 - 20 mg/dL   Creatinine, Ser 6.29 0.44 - 1.00 mg/dL   Calcium 9.3 8.9 - 52.8 mg/dL   Total Protein 5.4  (L) 6.5 - 8.1 g/dL   Albumin 2.5 (L) 3.5 - 5.0 g/dL   AST 42 (H) 15 - 41 U/L   ALT 50 (H) 0 - 44 U/L   Alkaline Phosphatase 58 38 - 126 U/L   Total Bilirubin 0.3 0.3 - 1.2 mg/dL   GFR, Estimated >41 >32 mL/min  Comment: (NOTE) Calculated using the CKD-EPI Creatinine Equation (2021)    Anion gap 10 5 - 15    Comment: Performed at [facility], [address], [place], [place] [address]  Brain natriuretic peptide     Status: Abnormal   Collection Time: 02/02/23 12:04 AM  Result Value Ref Range   B Natriuretic Peptide 131.3 (H) 0.0 - 100.0 pg/mL    Comment: Performed at [facility], [address] [place], [place] [address]  Troponin I (High Sensitivity)     Status: None   Collection Time: 02/02/23 12:04 AM  Result Value Ref Range   Troponin I (High Sensitivity) 5 <18 ng/L    Comment: (NOTE) Elevated high sensitivity troponin I (hsTnI) values and significant  changes across serial measurements may suggest ACS but many other  chronic and acute conditions are known to elevate hsTnI results.  Refer to the "Links" section for chest pain algorithms and additional  guidance. Performed at [facility], [address], [place], [place] [address]     MAU COURSE EKG showed NSR Labs for chest pain evaluation ordered; not concerning for acute cardiopulmonary process. Low suspicion for PE especially given normal pulse and normal oxygen saturation Suspect intercostal/musculoskeletal pain. Flexeril 5 mg given. Offered Ibuprofen but she declined. Reports having slightly lower level of chest pain but does not want any Flexeril to take at home, just wants to take home Ibuprofen and maybe Tylenol.  ASSESSMENT 1. Chest pain, likely musculoskeletal   2. Chest pain with normal EKG   3. Midly elevated LFTs   4. Postpartum care following vaginal delivery     PLAN Chest pain is likely musculoskeletal, caused by pregnancy given her history of previous episodes during this  pregnancy and after her first delivery.  She declined Flexeril; recommended NSAIDS or occasional Tylenol  as needed for pain. EKG showed NSR, Troponin is negative. Slightly elevated BNP is consistent with recent postpartum state, no intervention needed. She is normotensive, no signs symptoms of preeclampsia even with her slightly elevated LFTs and Cr 1.00 (she was informed to follow up with her providers). Discharged to home in stable provider.   Allergies as of 02/02/2023   No Known Allergies      Medication List     TAKE these medications    acetaminophen 500 MG tablet Commonly known as: TYLENOL Take 2 tablets (1,000 mg total) by mouth every 6 (six) hours as needed for moderate pain or mild pain.   ibuprofen 800 MG tablet Commonly known as: ADVIL Take 800 mg by mouth every 8 (eight) hours as needed for moderate pain.   prenatal multivitamin Tabs tablet Take 1 tablet by mouth daily at 12 noon.       Evaluation does not show pathology that would require ongoing emergent intervention or inpatient treatment. Patient is hemodynamically stable and mentating appropriately. Discussed findings and plan with patient, who agrees with care plan. All questions answered. Return precautions discussed and outpatient follow up recommendations given.  Tereso Newcomer, MD 02/02/2023 1:40 AM

## 2023-06-24 ENCOUNTER — Other Ambulatory Visit: Payer: Self-pay

## 2023-06-24 ENCOUNTER — Emergency Department (HOSPITAL_COMMUNITY)
Admission: EM | Admit: 2023-06-24 | Discharge: 2023-06-24 | Discharge status: Against Medical Advice | Payer: BC Managed Care – PPO | Attending: Emergency Medicine | Admitting: Emergency Medicine

## 2023-06-24 DIAGNOSIS — Z5321 Procedure and treatment not carried out due to patient leaving prior to being seen by health care provider: Secondary | ICD-10-CM | POA: Insufficient documentation

## 2023-06-24 DIAGNOSIS — T7840XA Allergy, unspecified, initial encounter: Secondary | ICD-10-CM | POA: Insufficient documentation

## 2023-06-24 NOTE — ED Triage Notes (Signed)
The pt reports  that she is allergic to her dogs and one was placed in her house after she had puppies and she gets stuffy and cannot breathe this has been going on since december
